# Patient Record
Sex: Female | Born: 1980 | Race: Black or African American | Hispanic: No | Smoking: Former smoker
Health system: Southern US, Community
[De-identification: ages and names within clinical notes are randomized; demographics above are authoritative.]

## PROBLEM LIST (undated history)

## (undated) DIAGNOSIS — D649 Anemia, unspecified: Secondary | ICD-10-CM

## (undated) DIAGNOSIS — I10 Essential (primary) hypertension: Secondary | ICD-10-CM

---

## 2002-04-25 ENCOUNTER — Other Ambulatory Visit: Admission: RE | Admit: 2002-04-25 | Discharge: 2002-04-25 | Payer: Self-pay | Admitting: *Deleted

## 2002-05-11 ENCOUNTER — Ambulatory Visit (HOSPITAL_COMMUNITY): Admission: RE | Admit: 2002-05-11 | Discharge: 2002-05-11 | Payer: Self-pay | Admitting: *Deleted

## 2002-05-11 ENCOUNTER — Encounter: Payer: Self-pay | Admitting: *Deleted

## 2002-05-16 ENCOUNTER — Inpatient Hospital Stay (HOSPITAL_COMMUNITY): Admission: AD | Admit: 2002-05-16 | Discharge: 2002-05-19 | Payer: Self-pay | Admitting: *Deleted

## 2003-09-23 HISTORY — PX: KNEE SURGERY: SHX244

## 2003-09-23 HISTORY — PX: ANKLE SURGERY: SHX546

## 2006-06-16 ENCOUNTER — Ambulatory Visit (HOSPITAL_COMMUNITY): Admission: AD | Admit: 2006-06-16 | Discharge: 2006-06-16 | Payer: Self-pay | Admitting: Gynecology

## 2006-06-16 ENCOUNTER — Encounter (INDEPENDENT_AMBULATORY_CARE_PROVIDER_SITE_OTHER): Payer: Self-pay | Admitting: *Deleted

## 2006-06-16 ENCOUNTER — Ambulatory Visit: Payer: Self-pay | Admitting: Gynecology

## 2006-07-03 ENCOUNTER — Ambulatory Visit: Payer: Self-pay | Admitting: Gynecology

## 2011-02-07 NOTE — Op Note (Signed)
Tracy [name], Tracy [name]             ACCOUNT NO.:  [id number]   MEDICAL RECORD NO.:  [id number]          PATIENT TYPE:  AMB   LOCATION:  [facility]                           FACILITY:  [facility]   PHYSICIAN:  Ginger Carne, MD  DATE OF BIRTH:  Jun 20, 1981   DATE OF PROCEDURE:  06/16/2006  DATE OF DISCHARGE:                                 OPERATIVE REPORT   PREOPERATIVE DIAGNOSIS:  First trimester incomplete abortion.   POSTOPERATIVE DIAGNOSIS:  First trimester incomplete abortion.   PROCEDURE:  Aspiration dilatation and curettage.   SURGEON:  Ginger Carne, MD   ASSISTANT:  None.   COMPLICATIONS:  None immediate.   ESTIMATED BLOOD LOSS:  Minimal.   ANESTHESIA:  General.   SPECIMEN:  Products of conception to pathology.   OPERATIVE FINDINGS:  External genitalia, vulva and vagina appeared normal.  The cervix demonstrated no evidence of erosions or lesions.  Uterus sounded  to 10 cm.  Both adnexa palpable found to be normal, products of conception  noted and sent to pathology.   OPERATIVE PROCEDURE:  The patient prepped in the usual fashion and placed  lithotomy position.  Betadine solution used for antiseptic.  The patient  catheterized prior to procedure.  After adequate general anesthesia,  tenaculum placed on the anterior lip of the cervix.  #8 suction cannulae  used for removal of intrauterine contents, specimen to pathology.  Minimal  bleeding noted at the end of the procedure.  The patient returned to the  post anesthesia recovery room in excellent condition.      Ginger Carne, MD  Electronically Signed     SHB/MEDQ  D:  06/16/2006  T:  06/18/2006  Job:  [id number]

## 2014-09-22 HISTORY — PX: OVARIAN CYST REMOVAL: SHX89

## 2016-09-22 HISTORY — PX: OVARIAN CYST REMOVAL: SHX89

## 2020-06-13 ENCOUNTER — Encounter: Payer: Self-pay | Admitting: Obstetrics and Gynecology

## 2020-06-13 ENCOUNTER — Other Ambulatory Visit: Payer: Self-pay

## 2020-06-13 ENCOUNTER — Other Ambulatory Visit (HOSPITAL_COMMUNITY)
Admission: RE | Admit: 2020-06-13 | Discharge: 2020-06-13 | Disposition: A | Discharge status: Home / Self Care | Payer: Medicaid Other | Source: Ambulatory Visit | Attending: Obstetrics and Gynecology | Admitting: Obstetrics and Gynecology

## 2020-06-13 ENCOUNTER — Ambulatory Visit: Payer: Medicaid Other | Admitting: Obstetrics and Gynecology

## 2020-06-13 VITALS — BP 148/91 | HR 99 | Ht 69.0 in | Wt 198.0 lb

## 2020-06-13 DIAGNOSIS — Z01411 Encounter for gynecological examination (general) (routine) with abnormal findings: Secondary | ICD-10-CM | POA: Diagnosis present

## 2020-06-13 DIAGNOSIS — D259 Leiomyoma of uterus, unspecified: Secondary | ICD-10-CM | POA: Diagnosis not present

## 2020-06-13 NOTE — Progress Notes (Signed)
NGYN pt referred from [facility] d/t fibroids.  Pt needs pap smear. STD testing offered pt; declined

## 2020-06-13 NOTE — Progress Notes (Signed)
Subjective:     Tracy [name] is a 39 y.o. female and is here for a comprehensive physical exam. The patient reports history of AUB. She describes a monthly period lasting 7 days, heavy in flow, associated with dysmenorrhea. Patient reports a history of fibroid uterus. Patient reports intermittent pelvic pain which is worse with her periods. She is not sexually active. She also admits to occasional urinary incontinence due to pelvic pressure  History reviewed. No pertinent past medical history. Past Surgical History:  Procedure Laterality Date  . ANKLE SURGERY  2005  . KNEE SURGERY  2005   both   . OVARIAN CYST REMOVAL  2016  . OVARIAN CYST REMOVAL  2018   Family History  Problem Relation Age of Onset  . Diabetes Maternal Grandfather   . Hypertension Maternal Grandfather      Social History   Socioeconomic History  . Marital status: Legally Separated    Spouse name: Not on file  . Number of children: Not on file  . Years of education: Not on file  . Highest education level: Not on file  Occupational History  . Not on file  Tobacco Use  . Smoking status: Former Research scientist (life sciences)  . Smokeless tobacco: Never Used  Substance and Sexual Activity  . Alcohol use: Not Currently  . Drug use: Never  . Sexual activity: Not Currently    Birth control/protection: None  Other Topics Concern  . Not on file  Social History Narrative  . Not on file   Social Determinants of Health   Financial Resource Strain:   . Difficulty of Paying Living Expenses: Not on file  Food Insecurity:   . Worried About Charity fundraiser in the Last Year: Not on file  . Ran Out of Food in the Last Year: Not on file  Transportation Needs:   . Lack of Transportation (Medical): Not on file  . Lack of Transportation (Non-Medical): Not on file  Physical Activity:   . Days of Exercise per Week: Not on file  . Minutes of Exercise per Session: Not on file  Stress:   . Feeling of Stress : Not on file  Social  Connections:   . Frequency of Communication with Friends and Family: Not on file  . Frequency of Social Gatherings with Friends and Family: Not on file  . Attends Religious Services: Not on file  . Active Member of Clubs or Organizations: Not on file  . Attends Archivist Meetings: Not on file  . Marital Status: Not on file  Intimate Partner Violence:   . Fear of Current or Ex-Partner: Not on file  . Emotionally Abused: Not on file  . Physically Abused: Not on file  . Sexually Abused: Not on file   Health Maintenance  Topic Date Due  . Hepatitis C Screening  Never done  . COVID-19 Vaccine (1) Never done  . HIV Screening  Never done  . TETANUS/TDAP  Never done  . PAP SMEAR-Modifier  Never done  . INFLUENZA VACCINE  Never done       Review of Systems Pertinent items noted in HPI and remainder of comprehensive ROS otherwise negative.   Objective:  Blood pressure (!) 148/91, pulse 99, height 5\' 9"  (1.753 m), weight 198 lb (89.8 kg).     GENERAL: Well-developed, well-nourished female in no acute distress.  HEENT: Normocephalic, atraumatic. Sclerae anicteric.  NECK: Supple. Normal thyroid.  LUNGS: Clear to auscultation bilaterally.  HEART: Regular rate and rhythm. BREASTS: Symmetric  in size. No palpable masses or lymphadenopathy, skin changes, or nipple drainage. ABDOMEN: Soft, nontender, nondistended. Palpable fibroid uterus PELVIC: Normal external female genitalia. Vagina is pink and rugated.  Normal discharge. Normal appearing cervix. Uterus is 18-weeks in size. No adnexal mass or tenderness. EXTREMITIES: No cyanosis, clubbing, or edema, 2+ distal pulses.    Assessment:    Healthy female exam.      Plan:    Pap smear collected Pelvic ultrasound ordered Discussed surgical management of AUB with hysterectomy, myomectomy and [place]. Patient undecided at this time but is considering a hysterectomy. Informed patient that due to rising cases of covid, major surgery may  be delayed into January/February Patient verbalized understanding and all questions were answered See After Visit Summary for Counseling Recommendations

## 2020-06-13 NOTE — Patient Instructions (Signed)
Uterine Fibroids  Uterine fibroids (leiomyomas) are noncancerous (benign) tumors that can develop in the uterus. Fibroids may also develop in the fallopian tubes, cervix, or tissues (ligaments) near the uterus. You may have one or many fibroids. Fibroids vary in size, weight, and where they grow in the uterus. Some can become quite large. Most fibroids do not require medical treatment. What are the causes? The cause of this condition is not known. What increases the risk? You are more likely to develop this condition if you:  Are in your 30s or 40s and have not gone through menopause.  Have a family history of this condition.  Are of African-American descent.  Had your first period at an early age (early menarche).  Have not had any children (nulliparity).  Are overweight or obese. What are the signs or symptoms? Many women do not have any symptoms. Symptoms of this condition may include:  Heavy menstrual bleeding.  Bleeding or spotting between periods.  Pain and pressure in the pelvic area, between the hips.  Bladder problems, such as needing to urinate urgently or more often than usual.  Inability to have children (infertility).  Failure to carry pregnancy to term (miscarriage). How is this diagnosed? This condition may be diagnosed based on:  Your symptoms and medical history.  A physical exam.  A pelvic exam that includes feeling for any tumors.  Imaging tests, such as ultrasound or MRI. How is this treated? Treatment for this condition may include:  Seeing your health care provider for follow-up visits to monitor your fibroids for any changes.  Taking NSAIDs such as ibuprofen, naproxen, or aspirin to reduce pain.  Hormone medicines. These may be taken as a pill, given in an injection, or delivered by a T-shaped device that is inserted into the uterus (intrauterine device, IUD).  Surgery to remove one of the following: ? The fibroids (myomectomy). Your health  care provider may recommend this if fibroids affect your fertility and you want to become pregnant. ? The uterus (hysterectomy). ? Blood supply to the fibroids (uterine artery embolization). Follow these instructions at home:  Take over-the-counter and prescription medicines only as told by your health care provider.  Ask your health care provider if you should take iron pills or eat more iron-rich foods, such as dark green, leafy vegetables. Heavy menstrual bleeding can cause low iron levels.  If directed, apply heat to your back or abdomen to reduce pain. Use the heat source that your health care provider recommends, such as a moist heat pack or a heating pad. ? Place a towel between your skin and the heat source. ? Leave the heat on for 20-30 minutes. ? Remove the heat if your skin turns bright red. This is especially important if you are unable to feel pain, heat, or cold. You may have a greater risk of getting burned.  Pay close attention to your menstrual cycle. Tell your health care provider about any changes, such as: ? Increased blood flow that requires you to use more pads or tampons than usual. ? A change in the number of days that your period lasts. ? A change in symptoms that are associated with your period, such as back pain or cramps in your abdomen.  Keep all follow-up visits as told by your health care provider. This is important, especially if your fibroids need to be monitored for any changes. Contact a health care provider if you:  Have pelvic pain, back pain, or cramps in your abdomen that   do not get better with medicine or heat.  Develop new bleeding between periods.  Have increased bleeding during or between periods.  Feel unusually tired or weak.  Feel light-headed. Get help right away if you:  Faint.  Have pelvic pain that suddenly gets worse.  Have severe vaginal bleeding that soaks a tampon or pad in 30 minutes or less. Summary  Uterine fibroids are  noncancerous (benign) tumors that can develop in the uterus.  The exact cause of this condition is not known.  Most fibroids do not require medical treatment unless they affect your ability to have children (fertility).  Contact a health care provider if you have pelvic pain, back pain, or cramps in your abdomen that do not get better with medicines.  Make sure you know what symptoms should cause you to get help right away. This information is not intended to replace advice given to you by your health care provider. Make sure you discuss any questions you have with your health care provider. Document Revised: 08/21/2017 Document Reviewed: 08/04/2017 Elsevier Patient Education  2020 [employer].   Myomectomy  Myomectomy is a surgery in which a non-cancerous fibroid (myoma) is removed from the uterus. Myomas are tumors made up of fibrous tissue. They are often called fibroid tumors. Fibroid tumors can range from the size of a pea to the size of a grapefruit. In a myomectomy, the fibroid tumor is removed without removing the uterus. Because these tumors are rarely cancerous, this surgery is usually done only if the tumor is growing or causing symptoms such as pain, pressure, bleeding, or pain with intercourse. Tell a health care provider about:  Any allergies you have.  All medicines you are taking, including vitamins, herbs, eye drops, creams, and over-the-counter medicines.  Any problems you or family members have had with anesthetic medicines.  Any blood disorders you have.  Any surgeries you have had.  Any medical conditions you have. What are the risks? Generally, this is a safe procedure. However, problems may occur, including:  Bleeding.  Infection.  Allergic reactions to medicines.  Damage to other structures or organs.  Blood clots in the legs, chest, or brain.  Scar tissue on other organs and in the pelvis. This may require another surgery to remove the scar  tissue. What happens before the procedure? Staying hydrated Follow instructions from your health care provider about hydration, which may include:  Up to 2 hours before the procedure - you may continue to drink clear liquids, such as water, clear fruit juice, black coffee, and plain tea. Eating and drinking restrictions Follow instructions from your health care provider about eating and drinking, which may include:  8 hours before the procedure - stop eating heavy meals or foods such as meat, fried foods, or fatty foods.  6 hours before the procedure - stop eating light meals or foods, such as toast or cereal.  6 hours before the procedure - stop drinking milk or drinks that contain milk.  2 hours before the procedure - stop drinking clear liquids. General instructions  Ask your health care provider about: ? Changing or stopping your regular medicines. This is especially important if you are taking diabetes medicines or blood thinners. ? Taking medicines such as aspirin and ibuprofen. These medicines can thin your blood. Do not take these medicines before your procedure if your health care provider instructs you not to.  Do not drink alcohol the day before the surgery.  Do not use any products that  contain nicotine or tobacco, such as cigarettes and e-cigarettes, for 2 weeks before the procedure. If you need help quitting, ask your health care provider.  Plan to have someone take you home from the hospital or clinic. Also arrange for someone to help you with activities during your recovery. What happens during the procedure?  To reduce your risk of infection: ? Your health care team will wash or sanitize their hands. ? Your skin will be washed with soap. ? Hair may be removed from the surgical area.  An IV tube will be inserted into one of your veins. Medicines will be able to flow directly into your body through this IV tube.  You will be given one or more of the following: ? A  medicine to help you relax (sedative). ? A medicine to make you fall asleep (general anesthetic).  Small monitors will be attached to your body. They will be used to check your heart, blood pressure, and oxygen level.  A breathing tube will be placed into your lungs during the procedure.  A thin, flexible tube (catheter) will be inserted into your bladder to collect urine.  Your surgeon will use one of the following methods to perform the procedure. The method used will depend on the size, shape, location, and number of fibroids. Hysteroscopic myomectomy This method may be used when the fibroid tumor is inside the cavity of the uterus. A long, thin tube with a lens (hysteroscope) will be inserted into the uterus through the vagina. A saline solution will be put into the uterus. This will expand the uterus and allow the surgeon to see the fibroids. Tools will be passed through the hysteroscope to remove the fibroid tumor in pieces. Laparoscopic myomectomy A few small incisions will be made in the lower abdomen. A thin, lighted tube with a camera (laparoscope) will be inserted through one of the incisions. This will give the surgeon a good view of the area. The fibroid tumor will be removed through the other incisions. The incisions will then be closed with stitches (sutures) or staples. Abdominal myomectomy This method is used when the fibroid tumor cannot be removed with a hysteroscope or laparoscope. The surgery will be done through a larger surgical incision in the abdomen. The fibroid tumor will be removed through this incision. The incision will be closed with sutures or staples. Recovery time will be longer if this method is used. The procedures may vary among health care providers and hospitals. What happens after the procedure?  Your blood pressure, heart rate, breathing rate, and blood oxygen level will be monitored until the medicines you were given have worn off.  The IV access tube  and catheter will remain on your body for a period of time.  You may be given medicine for pain or to help you sleep.  You may be given an antibiotic medicine if needed.  Do not drive for 24 hours if you were given a sedative. Summary  Myomectomy is surgery to remove a noncancerous fibroid (myoma) from the uterus.  This surgery is usually done only if the tumor is growing or causing symptoms such as pain, pressure, bleeding, or pain during intercourse.  Follow instructions from your health care provider about eating and drinking before the procedure.  Recovery time from this procedure depends on the method. The abdominal method will require a longer recovery time. This information is not intended to replace advice given to you by your health care provider. Make sure you discuss  any questions you have with your health care provider. Document Revised: 12/31/2018 Document Reviewed: 10/09/2016 Elsevier Patient Education  2020 [employer].  Uterine Artery Embolization for Fibroids  Uterine artery embolization is a procedure to shrink uterine fibroids. Uterine fibroids are masses of tissue (tumors) that can develop in the womb (uterus). They are also called leiomyomas. This type of tumor is not cancerous (benign) and does not spread to other parts of the body outside of the pelvic area. The pelvic area is the part of the body between the hip bones. You can have one or many fibroids. Fibroids can vary in size, shape, weight, and where they grow in the uterus. Some can become quite large. In this procedure, a thin plastic tube (catheter) is used to inject a chemical that blocks off the blood supply to the fibroid, which causes the fibroid to shrink. Tell a health care provider about:  Any allergies you have.  All medicines you are taking, including vitamins, herbs, eye drops, creams, and over-the-counter medicines.  Any problems you or family members have had with anesthetic medicines.  Any  blood disorders you have.  Any surgeries you have had.  Any medical conditions you have.  Whether you are pregnant or may be pregnant. What are the risks? Generally, this is a safe procedure. However, problems may occur, including:  Bleeding.  Allergic reactions to medicines or dyes.  Damage to other structures or organs.  Infection, including blood infection (septicemia).  Injury to the uterus from decreased blood supply.  Lack of menstrual periods (amenorrhea).  Death of tissue cells (necrosis) around your bladder or vulva.  Development of a hole between organs or from an organ to the surface of your skin (fistula).  Blood clot in the legs (deep vein thrombosis) or lung (pulmonary embolus).  Nausea and vomiting. What happens before the procedure? Staying hydrated Follow instructions from your health care provider about hydration, which may include:  Up to 2 hours before the procedure - you may continue to drink clear liquids, such as water, clear fruit juice, black coffee, and plain tea. Eating and drinking restrictions Follow instructions from your health care provider about eating and drinking, which may include:  8 hours before the procedure - stop eating heavy meals or foods such as meat, fried foods, or fatty foods.  6 hours before the procedure - stop eating light meals or foods, such as toast or cereal.  6 hours before the procedure - stop drinking milk or drinks that contain milk.  2 hours before the procedure - stop drinking clear liquids. Medicines  Ask your health care provider about: ? Changing or stopping your regular medicines. This is especially important if you are taking diabetes medicines or blood thinners. ? Taking over-the-counter medicines, vitamins, herbs, and supplements. ? Taking medicines such as aspirin and ibuprofen. These medicines can thin your blood. Do not take these medicines unless your health care provider tells you to take  them.  You may be given antibiotic medicine to help prevent infection.  You may be given medicine to prevent nausea and vomiting (antiemetic). General instructions  Ask your health care provider how your surgical site will be marked or identified.  You may be asked to shower with a germ-killing soap.  Plan to have someone take you home from the hospital or clinic.  If you will be going home right after the procedure, plan to have someone with you for 24 hours.  You will be asked to empty your  bladder. What happens during the procedure?  To lower your risk of infection: ? Your health care team will wash or sanitize their hands. ? Hair may be removed from the surgical area. ? Your skin will be washed with soap.  An IV will be inserted into one of your veins.  You will be given one or more of the following: ? A medicine to help you relax (sedative). ? A medicine to numb the area (local anesthetic).  A small cut (incision) will be made in your groin.  A catheter will be inserted into the main artery of your leg. The catheter will be guided through the artery to your uterus.  A series of images will be taken while dye is injected through the catheter in your groin. X-rays are taken at the same time. This is done to provide a road map of the blood supply to your uterus and fibroids.  Tiny plastic spheres, about the size of sand grains, will be injected through the catheter. Metal coils may be used to help block the artery. The particles will lodge in tiny branches of the uterine artery that supplies blood to the fibroids.  The procedure will be repeated on the artery that supplies the other side of the uterus.  The catheter will be removed and pressure will be applied to stop the bleeding.  A dressing will be placed over the incision. The procedure may vary among health care providers and hospitals. What happens after the procedure?  Your blood pressure, heart rate, breathing  rate, and blood oxygen level will be monitored until the medicines you were given have worn off.  You will be given pain medicine as needed.  You may be given medicine for nausea and vomiting as needed.  Do not drive for 24 hours if you were given a sedative. Summary  Uterine artery embolization is a procedure to shrink uterine fibroids by blocking the blood supply to the fibroid.  You may be given a sedative and local anesthetic for the procedure.  A catheter will be inserted into the main artery of your leg. The catheter will be guided through the artery to your uterus.  After the procedure you will be given pain medicine and medicine for nausea as needed.  Do not drive for 24 hours if you were given a sedative. This information is not intended to replace advice given to you by your health care provider. Make sure you discuss any questions you have with your health care provider. Document Revised: 08/21/2017 Document Reviewed: 12/11/2016 Elsevier Patient Education  [employer].  Hysterectomy Information  A hysterectomy is a surgery in which the uterus is removed. The fallopian tubes and ovaries may be removed (bilateral salpingo-oophorectomy) as well. This procedure may be done to treat various medical problems. After the procedure, a woman will no longer have menstrual periods nor will she be able to become pregnant (sterile). What are the reasons for a hysterectomy? There are many reasons why a woman might have this procedure. They include:  Persistent, abnormal vaginal bleeding.  Long-term (chronic) pelvic pain or infection.  Endometriosis. This is when the lining of the uterus (endometrium) starts to grow outside the uterus.  Adenomyosis. This is when the endometrium starts to grow in the muscle of the uterus.  Pelvic organ prolapse. This is a condition in which the uterus falls down into the vagina.  Noncancerous growths in the uterus (uterine fibroids) that cause  symptoms.  The presence of precancerous cells.  Cervical or uterine cancer. What are the different types of hysterectomy? There are three different types of hysterectomy:  Supracervical hysterectomy. In this type, the top part of the uterus is removed, but not the cervix.  Total hysterectomy. In this type, the uterus and cervix are removed.  Radical hysterectomy. In this type, the uterus, the cervix, and the tissue that holds the uterus in place (parametrium) are removed. What are the different ways a hysterectomy can be performed? There are many different ways a hysterectomy can be performed, including:  Abdominal hysterectomy. In this type, an incision is made in the abdomen. The uterus is removed through this incision.  Vaginal hysterectomy. In this type, an incision is made in the vagina. The uterus is removed through this incision. There are no abdominal incisions.  Conventional laparoscopic hysterectomy. In this type, three or four small incisions are made in the abdomen. A thin, lighted tube with a camera (laparoscope) is inserted into one of the incisions. Other tools are put through the other incisions. The uterus is cut into small pieces. The small pieces are removed through the incisions or through the vagina.  Laparoscopically assisted vaginal hysterectomy (LAVH). In this type, three or four small incisions are made in the abdomen. Part of the surgery is performed laparoscopically and the other part is done vaginally. The uterus is removed through the vagina.  Robot-assisted laparoscopic hysterectomy. In this type, a laparoscope and other tools are inserted into three or four small incisions in the abdomen. A computer-controlled device is used to give the surgeon a 3D image and to help control the surgical instruments. This allows for more precise movements of surgical instruments. The uterus is cut into small pieces and removed through the incisions or removed through the  vagina. Discuss the options with your health care provider to determine which type is the right one for you. What are the risks? Generally, this is a safe procedure. However, problems may occur, including:  Bleeding and risk of blood transfusion. Tell your health care provider if you do not want to receive any blood products.  Blood clots in the legs or lung.  Infection.  Damage to other structures or organs.  Allergic reactions to medicines.  Changing to an abdominal hysterectomy from one of the other techniques. What to expect after a hysterectomy  You will be given pain medicine.  You may need to stay in the hospital for 1- 2 days to recover, depending on the type of hysterectomy you had.  Follow your health care provider's instructions about exercise, driving, and general activities. Ask your health care provider what activities are safe for you.  You will need to have someone with you for the first 3-5 days after you go home.  You will need to follow up with your surgeon in 2-4 weeks after surgery to evaluate your progress.  If the ovaries are removed, you will have early menopause symptoms such as hot flashes, night sweats, and insomnia.  If you had a hysterectomy for a problem that was not cancer or not a condition that could lead to cancer, then you no longer need Pap tests. However, even if you no longer need a Pap test, a regular pelvic exam is a good idea to make sure no other problems are developing. Questions to ask your health care provider  Is a hysterectomy medically necessary? Do I have other treatment options for my condition?  What are my options for hysterectomy procedure?  What organs and tissues  need to be removed?  What are the risks?  What are the benefits?  How long will I need to stay in the hospital after the procedure?  How long will I need to recover at home?  What symptoms can I expect after the procedure? Summary  A hysterectomy is a  surgery in which the uterus is removed. The fallopian tubes and ovaries may be removed (bilateral salpingo-oophorectomy) as well.  This procedure may be done to treat various medical problems. After the procedure, a woman will no longer have menstrual periods nor will she be able to become pregnant.  Discuss the options with your health care provider to determine which type of hysterectomy is the right one for you. This information is not intended to replace advice given to you by your health care provider. Make sure you discuss any questions you have with your health care provider. Document Revised: 08/21/2017 Document Reviewed: 10/15/2016 Elsevier Patient Education  2020 [employer].

## 2020-06-15 LAB — CYTOLOGY - PAP
Adequacy: ABSENT
Comment: NEGATIVE
Diagnosis: NEGATIVE
High risk HPV: NEGATIVE

## 2020-06-19 ENCOUNTER — Other Ambulatory Visit: Payer: Self-pay

## 2020-06-19 ENCOUNTER — Ambulatory Visit
Admission: RE | Admit: 2020-06-19 | Discharge: 2020-06-19 | Disposition: A | Discharge status: Home / Self Care | Payer: Medicaid Other | Source: Ambulatory Visit | Attending: Obstetrics and Gynecology | Admitting: Obstetrics and Gynecology

## 2020-06-19 DIAGNOSIS — D259 Leiomyoma of uterus, unspecified: Secondary | ICD-10-CM | POA: Diagnosis present

## 2020-06-27 ENCOUNTER — Other Ambulatory Visit: Payer: Self-pay | Admitting: Obstetrics and Gynecology

## 2020-07-04 ENCOUNTER — Telehealth: Payer: Self-pay

## 2020-07-04 NOTE — Telephone Encounter (Signed)
Patient has been made aware  of ultrasound results and would like to just go ahead and have an hysterectomy. She is finish with having children. I have schedule her for a virtual appointment on 08/09/2020 however she would just like to have the surgery scheduled.

## 2020-07-04 NOTE — Telephone Encounter (Signed)
This patient has been made aware of scheduled surgery however she would like to have an hysterectomy instead of removal of fibroids. Please advise

## 2020-07-30 ENCOUNTER — Other Ambulatory Visit: Payer: Self-pay

## 2020-07-30 ENCOUNTER — Encounter: Payer: Self-pay | Admitting: Obstetrics and Gynecology

## 2020-07-30 ENCOUNTER — Telehealth (INDEPENDENT_AMBULATORY_CARE_PROVIDER_SITE_OTHER): Payer: Medicaid Other | Admitting: Obstetrics and Gynecology

## 2020-07-30 VITALS — BP 156/99 | HR 93 | Wt 202.0 lb

## 2020-07-30 DIAGNOSIS — Z712 Person consulting for explanation of examination or test findings: Secondary | ICD-10-CM

## 2020-07-30 NOTE — Progress Notes (Signed)
39 yo P2 here to discuss results of her pelvic ultrasound. Patient had ultrasound ordered as part of the evaluation of menorrhagia with regular cycles and dysmenorrhea. Patient is currently without any complaints and waiting for her surgery date  No past medical history on file. Past Surgical History:  Procedure Laterality Date  . ANKLE SURGERY  2005  . KNEE SURGERY  2005   both   . OVARIAN CYST REMOVAL  2016  . OVARIAN CYST REMOVAL  2018   Family History  Problem Relation Age of Onset  . Diabetes Maternal Grandfather   . Hypertension Maternal Grandfather    Social History   Tobacco Use  . Smoking status: Former Research scientist (life sciences)  . Smokeless tobacco: Never Used  Substance Use Topics  . Alcohol use: Not Currently  . Drug use: Never   ROS See pertinent in HPI. All other systems reviewed with the patient  Blood pressure (!) 156/99, pulse 93, weight 202 lb (91.6 kg). GENERAL: Well-developed, well-nourished female in no acute distress.  NEURO: alert and oriented x3  05/2020 ultrasound FINDINGS: Uterus  Measurements: 16.2 x 9.8 x 11.8 cm = volume: 974 mL. Anteverted. Enlarged and heterogeneous, containing multiple masses consistent with leiomyomata. Largest of these include an exophytic 6.4 x 7.4 x 5.5 cm RIGHT fundal leiomyoma, 6.7 x 6.4 x 5.0 cm anterior LEFT leiomyoma, and 5.8 x 4.5 x 4.3 cm posterior leiomyoma. The anterior and posterior leiomyomata extend submucosal.  Endometrium  Thickness: 5 mm. Small amount of endometrial fluid at upper uterine segment.  Right ovary  Measurements: 4.2 x 3.2 x 2.5 cm = volume: 17.9 mL. Cyst within RIGHT ovary 2.2 x 3.2 x 2.9 cm, without definite septation or mural nodularity. No additional masses.  Left ovary  Measurements: 3.1 x 2.0 x 2.2 cm = volume: 7.1 mL. Normal morphology without mass  Other findings  No free pelvic fluid.  No adnexal masses.  IMPRESSION: 3.2 cm diameter simple appearing RIGHT ovarian cyst; no  follow-up imaging is recommended.  Reference: Radiology 2010 Sep;256(3):943-54  Multiple uterine leiomyomata, some of which extend submucosal.  Small amount of nonspecific endometrial fluid at upper uterine segment endometrial canal.  A/P 39 yo with AUB and symtomatic fibroid uterus - Ultrasound results reviewed with the patient - Patient scheduled for definitive treatment with hysterectomy on 09/04/20 - All questions were concerned - Patient to follow up with PCP for hypertension management

## 2020-08-30 NOTE — Progress Notes (Signed)
[facility] [address], [place] AT [address] [place] [address] Phone: [phone] Fax: [phone]      Your procedure is scheduled on Tuesday, December 14th.  Report to [facility] Main Entrance "A" at 8:30 A.M., and check in at the Admitting office.  Call this number if you have problems the morning of surgery:  [phone]  Call [phone] if you have any questions prior to your surgery date Monday-Friday 8am-4pm    Remember:  Do not eat after midnight the night before your surgery  You may drink clear liquids until 7:30 AM the morning of your surgery.   Clear liquids allowed are: Water, Non-Citrus Juices (without pulp), Carbonated Beverages, Clear Tea, Black Coffee Only, and Gatorade    Take these medicines the morning of surgery with A SIP OF WATER   Amlodipine (Norvasc)  Eye drops - if needed  As of today, STOP taking any Aspirin (unless otherwise instructed by your surgeon) Aleve, Naproxen, Ibuprofen, Motrin, Advil, Goody's, BC's, all herbal medications, fish oil, and all vitamins.                      Do not wear jewelry, make up, or nail polish            Do not wear lotions, powders, perfumes, or deodorant.            Do not shave 48 hours prior to surgery.              Do not bring valuables to the hospital.            [facility] is not responsible for any belongings or valuables.  Do NOT Smoke (Tobacco/Vaping) or drink Alcohol 24 hours prior to your procedure If you use a CPAP at night, you may bring all equipment for your overnight stay.   Contacts, glasses, dentures or bridgework may not be worn into surgery.      For patients admitted to the hospital, discharge time will be determined by your treatment team.   Patients discharged the day of surgery will not be allowed to drive home, and someone needs to stay with them for 24 hours.    Special instructions:   [place]-  Preparing For Surgery  Before surgery, you can play an important role. Because skin is not sterile, your skin needs to be as free of germs as possible. You can reduce the number of germs on your skin by washing with CHG (chlorahexidine gluconate) Soap before surgery.  CHG is an antiseptic cleaner which kills germs and bonds with the skin to continue killing germs even after washing.    Oral Hygiene is also important to reduce your risk of infection.  Remember - BRUSH YOUR TEETH THE MORNING OF SURGERY WITH YOUR REGULAR TOOTHPASTE  Please do not use if you have an allergy to CHG or antibacterial soaps. If your skin becomes reddened/irritated stop using the CHG.  Do not shave (including legs and underarms) for at least 48 hours prior to first CHG shower. It is OK to shave your face.  Please follow these instructions carefully.   1. Shower the NIGHT BEFORE SURGERY and the MORNING OF SURGERY with CHG Soap.   2. If you chose to wash your hair, wash your hair first as usual with your normal shampoo.  3. After you shampoo, rinse your hair and body thoroughly to  remove the shampoo.  4. Use CHG as you would any other liquid soap. You can apply CHG directly to the skin and wash gently with a scrungie or a clean washcloth.   5. Apply the CHG Soap to your body ONLY FROM THE NECK DOWN.  Do not use on open wounds or open sores. Avoid contact with your eyes, ears, mouth and genitals (private parts). Wash Face and genitals (private parts)  with your normal soap.   6. Wash thoroughly, paying special attention to the area where your surgery will be performed.  7. Thoroughly rinse your body with warm water from the neck down.  8. DO NOT shower/wash with your normal soap after using and rinsing off the CHG Soap.  9. Pat yourself dry with a CLEAN TOWEL.  10. Wear CLEAN PAJAMAS to bed the night before surgery  11. Place CLEAN SHEETS on your bed the night of your first shower and DO NOT SLEEP WITH  PETS.   Day of Surgery: Wear Clean/Comfortable clothing the morning of surgery Do not apply any deodorants/lotions.   Remember to brush your teeth WITH YOUR REGULAR TOOTHPASTE.   Please read over the following fact sheets that you were given.

## 2020-08-31 ENCOUNTER — Encounter (HOSPITAL_COMMUNITY): Payer: Self-pay

## 2020-08-31 ENCOUNTER — Encounter (HOSPITAL_COMMUNITY)
Admission: RE | Admit: 2020-08-31 | Discharge: 2020-08-31 | Disposition: A | Discharge status: Home / Self Care | Payer: Medicaid Other | Source: Ambulatory Visit | Attending: Obstetrics and Gynecology | Admitting: Obstetrics and Gynecology

## 2020-08-31 ENCOUNTER — Other Ambulatory Visit (HOSPITAL_COMMUNITY)
Admission: RE | Admit: 2020-08-31 | Discharge: 2020-08-31 | Disposition: A | Discharge status: Home / Self Care | Payer: Medicaid Other | Source: Ambulatory Visit | Attending: Obstetrics and Gynecology | Admitting: Obstetrics and Gynecology

## 2020-08-31 ENCOUNTER — Other Ambulatory Visit: Payer: Self-pay

## 2020-08-31 DIAGNOSIS — Z20822 Contact with and (suspected) exposure to covid-19: Secondary | ICD-10-CM | POA: Diagnosis not present

## 2020-08-31 DIAGNOSIS — Z01812 Encounter for preprocedural laboratory examination: Secondary | ICD-10-CM | POA: Insufficient documentation

## 2020-08-31 HISTORY — DX: Essential (primary) hypertension: I10

## 2020-08-31 HISTORY — DX: Anemia, unspecified: D64.9

## 2020-08-31 LAB — BASIC METABOLIC PANEL
Anion gap: 9 (ref 5–15)
BUN: 6 mg/dL (ref 6–20)
CO2: 25 mmol/L (ref 22–32)
Calcium: 9 mg/dL (ref 8.9–10.3)
Chloride: 106 mmol/L (ref 98–111)
Creatinine, Ser: 0.68 mg/dL (ref 0.44–1.00)
GFR, Estimated: >60 mL/min (ref >60)
Glucose, Bld: 98 mg/dL (ref 70–99)
Potassium: 4.1 mmol/L (ref 3.5–5.1)
Sodium: 140 mmol/L (ref 135–145)

## 2020-08-31 LAB — CBC
HCT: 30.2 % — ABNORMAL LOW (ref 36.0–46.0)
Hemoglobin: 8.5 g/dL — ABNORMAL LOW (ref 12.0–15.0)
MCH: 22.6 pg — ABNORMAL LOW (ref 26.0–34.0)
MCHC: 28.1 g/dL — ABNORMAL LOW (ref 30.0–36.0)
MCV: 80.3 fL (ref 80.0–100.0)
Platelets: 357 10*3/uL (ref 150–400)
RBC: 3.76 MIL/uL — ABNORMAL LOW (ref 3.87–5.11)
RDW: 17.2 % — ABNORMAL HIGH (ref 11.5–15.5)
WBC: 5 10*3/uL (ref 4.0–10.5)
nRBC: 0 % (ref 0.0–0.2)

## 2020-08-31 LAB — SARS CORONAVIRUS 2 (TAT 6-24 HRS): SARS Coronavirus 2: NEGATIVE

## 2020-08-31 NOTE — Progress Notes (Signed)
CRITICAL VALUE ALERT @ PRE-ADMISSION APPOINTMENT  Critical Value:  Hgb 8.5  Date & Time Noted:  08-31-20 @ 1358  Provider Notified: yes, via inbox message  Orders Received/Actions taken: chart sent to anesthesia for review

## 2020-08-31 NOTE — Progress Notes (Signed)
PCP - Tereasa Coop, PA ([place]) Cardiologist - recently referred to Cardiologist  Appointment in early January for Cardiology consult, per PCP Due to high BP, chest tightness, palpitations, & abnormal EKG (see PCP note on 07-31-20, care everywhere)  Pt also has referral to Hematologist for iron deficiency anemia, per PCP Appointment for Hematology & Oncology consult at [facility] [place] - early January  Chest x-ray - n/a EKG - 07-31-20 (notes in care everywhere), requested EKG tracing from PCP    ERAS Protcol - yes, no drink ordered or given  COVID TEST- 08-31-20   Anesthesia review: yes, see above Jeneen Rinks notified of upcoming referrals to Cardiology & Hematology.  Per Jeneen Rinks, request EKG tracing from PCP.  If EKG tracing not returned by DOS, need to get repeat EKG.  Patient denies shortness of breath, fever, cough and chest pain at PAT appointment   All instructions explained to the patient, with a verbal understanding of the material. Patient agrees to go over the instructions while at home for a better understanding. Patient also instructed to self quarantine after being tested for COVID-19. The opportunity to ask questions was provided.

## 2020-09-03 NOTE — Progress Notes (Signed)
Anesthesia Chart Review:  Patient recently evaluated by her PCP Tereasa Coop, PA-C for report of palpitations and chest tightness.  Per note 07/31/2020, symptoms felt likely due to anemia, "I think that all of her symptoms of chest tightness, palpitations, headaches are likely secondary to her significant anemia. Last Hgb 8.2 and missed hematology appointment. Borderline sinus tach on EKG but left axis present. Refer to cardiology to rule out underlying cardiac etiology but consider anemia as etiology of sxs." EKG showed borderline sinus tach, LAD, no ST elevation or depression. However, she was also referred to cardiology to rule out any underlying cardiac etiology.  She has not yet been seen, appointment is in January. Last seen by PCP 08/24/20, HTN noted to be under better control on Amlodipine.   Spoke with patient at PAT appointment, she says her palpitations have improved, denies any chest pain, denies shortness of breath, denies any exertional symptoms.   Preop labs reviewed, anemia with Hgb 8.5 consistent with recent history. Otherwise unremarkable.   EKG 07/31/2020 (copy on chart): Sinus tach.  Rate 101.  Left axis.  Wynonia Musty [facility]/Anesthesiology Phone [phone] 09/03/2020 2:35 PM

## 2020-09-03 NOTE — Anesthesia Preprocedure Evaluation (Addendum)
Anesthesia Evaluation  Patient identified by MRN, date of birth, ID band Patient awake    Reviewed: Allergy & Precautions, NPO status , Patient's Chart, lab work & pertinent test results  Airway Mallampati: II  TM Distance: >3 FB Neck ROM: Full    Dental  (+) Teeth Intact, Dental Advisory Given   Pulmonary former smoker,    breath sounds clear to auscultation       Cardiovascular hypertension,  Rhythm:Regular Rate:Normal     Neuro/Psych    GI/Hepatic   Endo/Other    Renal/GU      Musculoskeletal   Abdominal   Peds  Hematology   Anesthesia Other Findings   Reproductive/Obstetrics                            Anesthesia Physical Anesthesia Plan  ASA: II  Anesthesia Plan: General   Post-op Pain Management:    Induction: Intravenous  PONV Risk Score and Plan: Ondansetron and Dexamethasone  Airway Management Planned: Oral ETT  Additional Equipment:   Intra-op Plan:   Post-operative Plan: Extubation in OR  Informed Consent: I have reviewed the patients History and Physical, chart, labs and discussed the procedure including the risks, benefits and alternatives for the proposed anesthesia with the patient or authorized representative who has indicated his/her understanding and acceptance.     Dental advisory given  Plan Discussed with: CRNA and Anesthesiologist  Anesthesia Plan Comments: (PAT note by Karoline Caldwell, PA-C:  Patient recently evaluated by her PCP Tereasa Coop, PA-C for report of palpitations and chest tightness.  Per note 07/31/2020, symptoms felt likely due to anemia, "I think that all of her symptoms of chest tightness, palpitations, headaches are likely secondary to her significant anemia. Last Hgb 8.2 and missed hematology appointment. Borderline sinus tach on EKG but left axis present. Refer to cardiology to rule out underlying cardiac etiology but consider anemia as  etiology of sxs." EKG showed borderline sinus tach, LAD, no ST elevation or depression. However, she was also referred to cardiology to rule out any underlying cardiac etiology.  She has not yet been seen, appointment is in January. Last seen by PCP 08/24/20, HTN noted to be under better control on Amlodipine.   Spoke with patient at PAT appointment, she says her palpitations have improved, denies any chest pain, denies shortness of breath, denies any exertional symptoms.   Preop labs reviewed, anemia with Hgb 8.5 consistent with recent history. Otherwise unremarkable.   EKG 07/31/2020 (copy on chart): Sinus tach.  Rate 101.  Left axis.  )       Anesthesia Quick Evaluation

## 2020-09-03 NOTE — H&P (Signed)
Shahira Fiske is an 39 y.o. female P2 with menorrhagia and regular cycles presenting today for definitive treatment with hysterectomy. Patient is currently without complaints. She reports a monthly 7-day period heavy in flow associated with dysmenorrhea  Pertinent Gynecological History: Menses: regular every month without intermenstrual spotting Contraception: abstinence DES exposure: denies Blood transfusions: none Last pap: normal Date: 05/2020   Menstrual History: Patient's last menstrual period was 08/31/2020.    Past Medical History:  Diagnosis Date  . Anemia   . Hypertension     Past Surgical History:  Procedure Laterality Date  . ANKLE SURGERY  2005  . KNEE SURGERY  2005   both   . OVARIAN CYST REMOVAL  2016  . OVARIAN CYST REMOVAL  2018    Family History  Problem Relation Age of Onset  . Diabetes Maternal Grandfather   . Hypertension Maternal Grandfather     Social History:  reports that she has quit smoking. She has never used smokeless tobacco. She reports current alcohol use. She reports current drug use. Drug: Marijuana.  Allergies: No Known Allergies  Medications Prior to Admission  Medication Sig Dispense Refill Last Dose  . amLODipine (NORVASC) 5 MG tablet Take 5 mg by mouth daily.   09/04/2020 at 0700  . Aspirin-Caffeine (BC FAST PAIN RELIEF PO) Take 1 packet by mouth daily as needed (pain).   Past Month at Unknown time  . Carboxymethylcellul-Glycerin (LUBRICATING EYE DROPS OP) Place 1 drop into both eyes daily as needed (dry eyes).   Past Week at Unknown time  . Ferrous Sulfate (IRON PO) Take 2 tablets by mouth daily.   Past Week at Unknown time  . ibuprofen (ADVIL) 200 MG tablet Take 800-1,200 mg by mouth 2 (two) times daily as needed for moderate pain.   Past Week at Unknown time  . triamcinolone (KENALOG) 0.1 % Apply 1 application topically 2 (two) times daily as needed for rash.   More than a month at Unknown time    Review of Systems See  pertinent in HPI. All other systems reviewed and non contributory Blood pressure (!) 137/96, pulse 81, temperature 98.5 F (36.9 C), temperature source Oral, resp. rate 18, height 5\' 9"  (1.753 m), weight 92.5 kg, last menstrual period 08/31/2020, SpO2 100 %. Physical Exam GENERAL: Well-developed, well-nourished female in no acute distress.  LUNGS: Clear to auscultation bilaterally.  HEART: Regular rate and rhythm. ABDOMEN: Soft, nontender, nondistended. No organomegaly. PELVIC: Deferred to OR EXTREMITIES: No cyanosis, clubbing, or edema, 2+ distal pulses.  Results for orders placed or performed during the hospital encounter of 09/04/20 (from the past 24 hour(s))  ABO/Rh     Status: None (Preliminary result)   Collection Time: 09/04/20  8:40 AM  Result Value Ref Range   ABO/RH(D) PENDING     No results found. 05/2020 ultrasound 05/2020 ultrasound FINDINGS: Uterus  Measurements: 16.2 x 9.8 x 11.8 cm = volume: 974 mL. Anteverted. Enlarged and heterogeneous, containing multiple masses consistent with leiomyomata. Largest of these include an exophytic 6.4 x 7.4 x 5.5 cm RIGHT fundal leiomyoma, 6.7 x 6.4 x 5.0 cm anterior LEFT leiomyoma, and 5.8 x 4.5 x 4.3 cm posterior leiomyoma. The anterior and posterior leiomyomata extend submucosal.  Endometrium  Thickness: 5 mm. Small amount of endometrial fluid at upper uterine segment.  Right ovary  Measurements: 4.2 x 3.2 x 2.5 cm = volume: 17.9 mL. Cyst within RIGHT ovary 2.2 x 3.2 x 2.9 cm, without definite septation or mural nodularity. No additional masses.  Left ovary  Measurements: 3.1 x 2.0 x 2.2 cm = volume: 7.1 mL. Normal morphology without mass  Other findings  No free pelvic fluid. No adnexal masses.  IMPRESSION: 3.2 cm diameter simple appearing RIGHT ovarian cyst; no follow-up imaging is recommended.  Reference: Radiology 2010 Sep;256(3):943-54  Multiple uterine leiomyomata, some of which extend  submucosal.  Small amount of nonspecific endometrial fluid at upper uterine segment endometrial canal.  Assessment/Plan: 39 yo P2 with symptomatic fibroid uterus here for definitive treatment with hysterectomy - Risks, benefits and alternatives were explained including but not limited to risks of bleeding, infection and damage to adjacent organs. Patient verbalized understanding and all questions were answered  Marlayna Bannister 09/04/2020, 9:32 AM

## 2020-09-04 ENCOUNTER — Encounter (HOSPITAL_COMMUNITY)
Admission: RE | Disposition: A | Discharge status: Home / Self Care | Payer: Self-pay | Source: Home / Self Care | Attending: Obstetrics and Gynecology

## 2020-09-04 ENCOUNTER — Inpatient Hospital Stay (HOSPITAL_COMMUNITY): Payer: Medicaid Other | Admitting: Anesthesiology

## 2020-09-04 ENCOUNTER — Other Ambulatory Visit: Payer: Self-pay

## 2020-09-04 ENCOUNTER — Inpatient Hospital Stay (HOSPITAL_COMMUNITY): Payer: Medicaid Other | Admitting: Physician Assistant

## 2020-09-04 ENCOUNTER — Inpatient Hospital Stay (HOSPITAL_COMMUNITY)
Admission: RE | Admit: 2020-09-04 | Discharge: 2020-09-06 | DRG: 743 | Disposition: A | Discharge status: Home / Self Care | Payer: Medicaid Other | Attending: Obstetrics and Gynecology | Admitting: Obstetrics and Gynecology

## 2020-09-04 ENCOUNTER — Encounter (HOSPITAL_COMMUNITY): Payer: Self-pay | Admitting: Obstetrics and Gynecology

## 2020-09-04 DIAGNOSIS — N920 Excessive and frequent menstruation with regular cycle: Secondary | ICD-10-CM | POA: Diagnosis present

## 2020-09-04 DIAGNOSIS — N83201 Unspecified ovarian cyst, right side: Secondary | ICD-10-CM | POA: Diagnosis present

## 2020-09-04 DIAGNOSIS — Z90710 Acquired absence of both cervix and uterus: Secondary | ICD-10-CM | POA: Diagnosis present

## 2020-09-04 DIAGNOSIS — D259 Leiomyoma of uterus, unspecified: Principal | ICD-10-CM | POA: Diagnosis present

## 2020-09-04 DIAGNOSIS — Z87891 Personal history of nicotine dependence: Secondary | ICD-10-CM | POA: Diagnosis not present

## 2020-09-04 DIAGNOSIS — N946 Dysmenorrhea, unspecified: Secondary | ICD-10-CM | POA: Diagnosis present

## 2020-09-04 HISTORY — PX: CYSTOSCOPY: SHX5120

## 2020-09-04 HISTORY — PX: HYSTERECTOMY ABDOMINAL WITH SALPINGECTOMY: SHX6725

## 2020-09-04 LAB — POCT I-STAT EG7
Acid-base deficit: 3 mmol/L — ABNORMAL HIGH (ref 0.0–2.0)
Bicarbonate: 22.9 mmol/L (ref 20.0–28.0)
Calcium, Ion: 1.12 mmol/L — ABNORMAL LOW (ref 1.15–1.40)
HCT: 29 % — ABNORMAL LOW (ref 36.0–46.0)
Hemoglobin: 9.9 g/dL — ABNORMAL LOW (ref 12.0–15.0)
O2 Saturation: 93 %
Patient temperature: 35.7
Potassium: 3.5 mmol/L (ref 3.5–5.1)
Sodium: 140 mmol/L (ref 135–145)
TCO2: 24 mmol/L (ref 22–32)
pCO2, Ven: 41.2 mmHg — ABNORMAL LOW (ref 44.0–60.0)
pH, Ven: 7.347 (ref 7.250–7.430)
pO2, Ven: 67 mmHg — ABNORMAL HIGH (ref 32.0–45.0)

## 2020-09-04 LAB — CBC
HCT: 34.8 % — ABNORMAL LOW (ref 36.0–46.0)
Hemoglobin: 10.5 g/dL — ABNORMAL LOW (ref 12.0–15.0)
MCH: 24.4 pg — ABNORMAL LOW (ref 26.0–34.0)
MCHC: 30.2 g/dL (ref 30.0–36.0)
MCV: 80.9 fL (ref 80.0–100.0)
Platelets: 346 10*3/uL (ref 150–400)
RBC: 4.3 MIL/uL (ref 3.87–5.11)
RDW: 18.3 % — ABNORMAL HIGH (ref 11.5–15.5)
WBC: 11.9 10*3/uL — ABNORMAL HIGH (ref 4.0–10.5)
nRBC: 0 % (ref 0.0–0.2)

## 2020-09-04 LAB — CREATININE, SERUM
Creatinine, Ser: 0.91 mg/dL (ref 0.44–1.00)
GFR, Estimated: >60 mL/min (ref >60)

## 2020-09-04 LAB — POCT PREGNANCY, URINE: Preg Test, Ur: NEGATIVE

## 2020-09-04 LAB — ABO/RH: ABO/RH(D): O POS

## 2020-09-04 SURGERY — HYSTERECTOMY, TOTAL, ABDOMINAL, WITH SALPINGECTOMY
Anesthesia: General | Site: Urethra

## 2020-09-04 MED ORDER — ONDANSETRON HCL 4 MG PO TABS: 4.0000 mg | ORAL_TABLET | Freq: Four times a day (QID) | ORAL | Status: DC | PRN

## 2020-09-04 MED ORDER — FENTANYL CITRATE (PF) 100 MCG/2ML IJ SOLN: 50.0000 ug | Freq: Once | INTRAMUSCULAR | Status: AC

## 2020-09-04 MED ORDER — LACTATED RINGERS IV SOLN: INTRAVENOUS | Status: DC

## 2020-09-04 MED ORDER — PHENYLEPHRINE 40 MCG/ML (10ML) SYRINGE FOR IV PUSH (FOR BLOOD PRESSURE SUPPORT)
PREFILLED_SYRINGE | INTRAVENOUS | Status: AC
  Filled 2020-09-04: qty 10

## 2020-09-04 MED ORDER — POVIDONE-IODINE 10 % EX SWAB: 2.0000 "application " | Freq: Once | CUTANEOUS | Status: DC

## 2020-09-04 MED ORDER — AMLODIPINE BESYLATE 5 MG PO TABS
5.0000 mg | ORAL_TABLET | Freq: Every day | ORAL | Status: DC
  Administered 2020-09-05 – 2020-09-06 (×2): 5 mg via ORAL
  Filled 2020-09-04 (×2): qty 1

## 2020-09-04 MED ORDER — SODIUM CHLORIDE 0.9 % IV SOLN: INTRAVENOUS | Status: DC | PRN

## 2020-09-04 MED ORDER — CHLORHEXIDINE GLUCONATE 0.12 % MT SOLN
15.0000 mL | Freq: Once | OROMUCOSAL | Status: AC
  Administered 2020-09-04: 10:00:00 15 mL via OROMUCOSAL
  Filled 2020-09-04: qty 15

## 2020-09-04 MED ORDER — FENTANYL CITRATE (PF) 100 MCG/2ML IJ SOLN
INTRAMUSCULAR | Status: AC
  Administered 2020-09-04: 10:00:00 50 ug via INTRAVENOUS
  Filled 2020-09-04: qty 2

## 2020-09-04 MED ORDER — DEXAMETHASONE SODIUM PHOSPHATE 10 MG/ML IJ SOLN
INTRAMUSCULAR | Status: AC
  Filled 2020-09-04: qty 1

## 2020-09-04 MED ORDER — NALOXONE HCL 0.4 MG/ML IJ SOLN: 0.4000 mg | INTRAMUSCULAR | Status: DC | PRN

## 2020-09-04 MED ORDER — MIDAZOLAM HCL 5 MG/5ML IJ SOLN
INTRAMUSCULAR | Status: DC | PRN
  Administered 2020-09-04 (×2): 1 mg via INTRAVENOUS

## 2020-09-04 MED ORDER — DOCUSATE SODIUM 100 MG PO CAPS
100.0000 mg | ORAL_CAPSULE | Freq: Two times a day (BID) | ORAL | Status: DC
  Administered 2020-09-04 – 2020-09-06 (×4): 100 mg via ORAL
  Filled 2020-09-04 (×4): qty 1

## 2020-09-04 MED ORDER — LIDOCAINE 2% (20 MG/ML) 5 ML SYRINGE
INTRAMUSCULAR | Status: DC | PRN
  Administered 2020-09-04: 60 mg via INTRAVENOUS

## 2020-09-04 MED ORDER — ONDANSETRON HCL 4 MG/2ML IJ SOLN
INTRAMUSCULAR | Status: AC
  Filled 2020-09-04: qty 2

## 2020-09-04 MED ORDER — OXYCODONE HCL 5 MG PO TABS: 5.0000 mg | ORAL_TABLET | Freq: Once | ORAL | Status: AC | PRN

## 2020-09-04 MED ORDER — ORAL CARE MOUTH RINSE: 15.0000 mL | Freq: Once | OROMUCOSAL | Status: AC

## 2020-09-04 MED ORDER — SUGAMMADEX SODIUM 200 MG/2ML IV SOLN
INTRAVENOUS | Status: DC | PRN
  Administered 2020-09-04: 200 mg via INTRAVENOUS

## 2020-09-04 MED ORDER — ENOXAPARIN SODIUM 40 MG/0.4ML ~~LOC~~ SOLN
40.0000 mg | SUBCUTANEOUS | Status: DC
  Administered 2020-09-05 – 2020-09-06 (×2): 40 mg via SUBCUTANEOUS
  Filled 2020-09-04 (×2): qty 0.4

## 2020-09-04 MED ORDER — MIDAZOLAM HCL 2 MG/2ML IJ SOLN: 2.0000 mg | Freq: Once | INTRAMUSCULAR | Status: AC

## 2020-09-04 MED ORDER — FENTANYL CITRATE (PF) 100 MCG/2ML IJ SOLN
INTRAMUSCULAR | Status: AC
  Administered 2020-09-04: 13:00:00 50 ug via INTRAVENOUS
  Filled 2020-09-04: qty 2

## 2020-09-04 MED ORDER — ONDANSETRON HCL 4 MG/2ML IJ SOLN
4.0000 mg | Freq: Four times a day (QID) | INTRAMUSCULAR | Status: DC | PRN
  Administered 2020-09-04 – 2020-09-05 (×2): 4 mg via INTRAVENOUS
  Filled 2020-09-04 (×2): qty 2

## 2020-09-04 MED ORDER — BUPIVACAINE HCL (PF) 0.25 % IJ SOLN
INTRAMUSCULAR | Status: AC
  Filled 2020-09-04: qty 30

## 2020-09-04 MED ORDER — PROPOFOL 10 MG/ML IV BOLUS
INTRAVENOUS | Status: DC | PRN
  Administered 2020-09-04: 50 mg via INTRAVENOUS
  Administered 2020-09-04: 150 mg via INTRAVENOUS

## 2020-09-04 MED ORDER — ESMOLOL HCL 100 MG/10ML IV SOLN
INTRAVENOUS | Status: DC | PRN
  Administered 2020-09-04: 30 mg via INTRAVENOUS

## 2020-09-04 MED ORDER — MIDAZOLAM HCL 2 MG/2ML IJ SOLN
INTRAMUSCULAR | Status: AC
  Administered 2020-09-04: 10:00:00 2 mg via INTRAVENOUS
  Filled 2020-09-04: qty 2

## 2020-09-04 MED ORDER — ONDANSETRON HCL 4 MG/2ML IJ SOLN
INTRAMUSCULAR | Status: DC | PRN
  Administered 2020-09-04: 4 mg via INTRAVENOUS

## 2020-09-04 MED ORDER — OXYCODONE HCL 5 MG/5ML PO SOLN
5.0000 mg | Freq: Once | ORAL | Status: AC | PRN
Start: 2020-09-04 — End: 2020-09-04
  Administered 2020-09-04: 5 mg via ORAL

## 2020-09-04 MED ORDER — ROCURONIUM BROMIDE 10 MG/ML (PF) SYRINGE
PREFILLED_SYRINGE | INTRAVENOUS | Status: AC
  Filled 2020-09-04: qty 10

## 2020-09-04 MED ORDER — IBUPROFEN 800 MG PO TABS
800.0000 mg | ORAL_TABLET | Freq: Three times a day (TID) | ORAL | Status: DC
  Administered 2020-09-04 – 2020-09-06 (×5): 800 mg via ORAL
  Filled 2020-09-04 (×5): qty 1

## 2020-09-04 MED ORDER — LABETALOL HCL 5 MG/ML IV SOLN: 5.0000 mg | Freq: Once | INTRAVENOUS | Status: AC

## 2020-09-04 MED ORDER — ONDANSETRON HCL 4 MG/2ML IJ SOLN: 4.0000 mg | Freq: Four times a day (QID) | INTRAMUSCULAR | Status: DC | PRN

## 2020-09-04 MED ORDER — INDIGOTINDISULFONATE SODIUM 8 MG/ML IJ SOLN
40.0000 mg | Freq: Once | INTRAMUSCULAR | Status: AC
  Administered 2020-09-04: 12:00:00 40 mg via INTRAVENOUS
  Filled 2020-09-04: qty 5

## 2020-09-04 MED ORDER — DEXAMETHASONE SODIUM PHOSPHATE 4 MG/ML IJ SOLN
INTRAMUSCULAR | Status: DC | PRN
  Administered 2020-09-04: 10 mg via INTRAVENOUS

## 2020-09-04 MED ORDER — HYDROMORPHONE 1 MG/ML IV SOLN
INTRAVENOUS | Status: DC
Start: 2020-09-04 — End: 2020-09-05
  Administered 2020-09-04: 2.7 mL via INTRAVENOUS
  Administered 2020-09-04: 4.4 mg via INTRAVENOUS
  Administered 2020-09-05: 1.5 mg via INTRAVENOUS
  Administered 2020-09-05 (×2): 1.2 mg via INTRAVENOUS

## 2020-09-04 MED ORDER — FENTANYL CITRATE (PF) 100 MCG/2ML IJ SOLN
INTRAMUSCULAR | Status: DC | PRN
  Administered 2020-09-04 (×3): 50 ug via INTRAVENOUS

## 2020-09-04 MED ORDER — FENTANYL CITRATE (PF) 250 MCG/5ML IJ SOLN
INTRAMUSCULAR | Status: AC
  Filled 2020-09-04: qty 5

## 2020-09-04 MED ORDER — FENTANYL CITRATE (PF) 100 MCG/2ML IJ SOLN
INTRAMUSCULAR | Status: AC
  Filled 2020-09-04: qty 2

## 2020-09-04 MED ORDER — BUPIVACAINE HCL (PF) 0.25 % IJ SOLN
INTRAMUSCULAR | Status: DC | PRN
  Administered 2020-09-04: 20 mL

## 2020-09-04 MED ORDER — DIPHENHYDRAMINE HCL 50 MG/ML IJ SOLN: 12.5000 mg | Freq: Four times a day (QID) | INTRAMUSCULAR | Status: DC | PRN

## 2020-09-04 MED ORDER — GABAPENTIN 100 MG PO CAPS
100.0000 mg | ORAL_CAPSULE | Freq: Three times a day (TID) | ORAL | Status: DC
  Administered 2020-09-04 – 2020-09-06 (×5): 100 mg via ORAL
  Filled 2020-09-04 (×5): qty 1

## 2020-09-04 MED ORDER — OXYCODONE HCL 5 MG/5ML PO SOLN
ORAL | Status: AC
  Filled 2020-09-04: qty 5

## 2020-09-04 MED ORDER — CEFAZOLIN SODIUM-DEXTROSE 2-4 GM/100ML-% IV SOLN
2.0000 g | INTRAVENOUS | Status: AC
  Administered 2020-09-04: 10:00:00 2 g via INTRAVENOUS
  Filled 2020-09-04: qty 100

## 2020-09-04 MED ORDER — ROCURONIUM BROMIDE 10 MG/ML (PF) SYRINGE
PREFILLED_SYRINGE | INTRAVENOUS | Status: DC | PRN
  Administered 2020-09-04 (×2): 20 mg via INTRAVENOUS
  Administered 2020-09-04: 60 mg via INTRAVENOUS
  Administered 2020-09-04: 20 mg via INTRAVENOUS

## 2020-09-04 MED ORDER — ALBUMIN HUMAN 5 % IV SOLN: INTRAVENOUS | Status: DC | PRN

## 2020-09-04 MED ORDER — MIDAZOLAM HCL 2 MG/2ML IJ SOLN
INTRAMUSCULAR | Status: AC
  Filled 2020-09-04: qty 2

## 2020-09-04 MED ORDER — DIPHENHYDRAMINE HCL 12.5 MG/5ML PO ELIX: 12.5000 mg | ORAL_SOLUTION | Freq: Four times a day (QID) | ORAL | Status: DC | PRN

## 2020-09-04 MED ORDER — LABETALOL HCL 5 MG/ML IV SOLN
INTRAVENOUS | Status: AC
  Administered 2020-09-04: 14:00:00 5 mg via INTRAVENOUS
  Filled 2020-09-04: qty 4

## 2020-09-04 MED ORDER — ONDANSETRON HCL 4 MG/2ML IJ SOLN
4.0000 mg | Freq: Once | INTRAMUSCULAR | Status: AC | PRN
  Administered 2020-09-04: 13:00:00 4 mg via INTRAVENOUS

## 2020-09-04 MED ORDER — METOPROLOL TARTRATE 5 MG/5ML IV SOLN
INTRAVENOUS | Status: DC | PRN
  Administered 2020-09-04: 2 mg via INTRAVENOUS

## 2020-09-04 MED ORDER — SODIUM CHLORIDE 0.9% FLUSH: 9.0000 mL | INTRAVENOUS | Status: DC | PRN

## 2020-09-04 MED ORDER — FENTANYL CITRATE (PF) 100 MCG/2ML IJ SOLN
25.0000 ug | INTRAMUSCULAR | Status: DC | PRN
  Administered 2020-09-04 (×2): 50 ug via INTRAVENOUS

## 2020-09-04 MED ORDER — HYDROMORPHONE 1 MG/ML IV SOLN
INTRAVENOUS | Status: AC
  Administered 2020-09-04: 30 mg via INTRAVENOUS
  Filled 2020-09-04: qty 30

## 2020-09-04 MED ORDER — LIDOCAINE 2% (20 MG/ML) 5 ML SYRINGE
INTRAMUSCULAR | Status: AC
  Filled 2020-09-04: qty 5

## 2020-09-04 SURGICAL SUPPLY — 35 items
CANISTER SUCT 3000ML PPV (MISCELLANEOUS) ×4 IMPLANT
COVER WAND RF STERILE (DRAPES) ×4 IMPLANT
DRAPE CESAREAN BIRTH W POUCH (DRAPES) ×4 IMPLANT
DRSG OPSITE POSTOP 4X10 (GAUZE/BANDAGES/DRESSINGS) ×4 IMPLANT
DURAPREP 26ML APPLICATOR (WOUND CARE) ×4 IMPLANT
GAUZE 4X4 16PLY RFD (DISPOSABLE) ×4 IMPLANT
GAUZE SPONGE 4X4 12PLY STRL LF (GAUZE/BANDAGES/DRESSINGS) ×4 IMPLANT
GLOVE BIOGEL PI IND STRL 6.5 (GLOVE) ×2 IMPLANT
GLOVE BIOGEL PI IND STRL 7.0 (GLOVE) ×4 IMPLANT
GLOVE BIOGEL PI INDICATOR 6.5 (GLOVE) ×2
GLOVE BIOGEL PI INDICATOR 7.0 (GLOVE) ×4
GLOVE SURG SS PI 6.5 STRL IVOR (GLOVE) ×4 IMPLANT
GOWN STRL REUS W/ TWL LRG LVL3 (GOWN DISPOSABLE) ×6 IMPLANT
GOWN STRL REUS W/TWL LRG LVL3 (GOWN DISPOSABLE) ×12
HIBICLENS CHG 4% 4OZ BTL (MISCELLANEOUS) ×4 IMPLANT
KIT TURNOVER KIT B (KITS) ×4 IMPLANT
NEEDLE HYPO 22GX1.5 SAFETY (NEEDLE) ×4 IMPLANT
NS IRRIG 1000ML POUR BTL (IV SOLUTION) ×4 IMPLANT
PACK ABDOMINAL GYN (CUSTOM PROCEDURE TRAY) ×4 IMPLANT
PAD ABD 8X7 1/2 STERILE (GAUZE/BANDAGES/DRESSINGS) ×4 IMPLANT
PAD ARMBOARD 7.5X6 YLW CONV (MISCELLANEOUS) ×4 IMPLANT
PAD OB MATERNITY 4.3X12.25 (PERSONAL CARE ITEMS) ×4 IMPLANT
SPONGE LAP 18X18 RF (DISPOSABLE) ×4 IMPLANT
SUT VIC AB 0 CT1 18XCR BRD8 (SUTURE) ×6 IMPLANT
SUT VIC AB 0 CT1 27 (SUTURE) ×8
SUT VIC AB 0 CT1 27XBRD ANBCTR (SUTURE) ×4 IMPLANT
SUT VIC AB 0 CT1 36 (SUTURE) ×8 IMPLANT
SUT VIC AB 0 CT1 8-18 (SUTURE) ×12
SUT VIC AB 2-0 CT1 27 (SUTURE) ×4
SUT VIC AB 2-0 CT1 TAPERPNT 27 (SUTURE) ×2 IMPLANT
SUT VIC AB 4-0 KS 27 (SUTURE) ×4 IMPLANT
SUT VICRYL 0 TIES 12 18 (SUTURE) ×4 IMPLANT
SYR CONTROL 10ML LL (SYRINGE) ×4 IMPLANT
TOWEL GREEN STERILE FF (TOWEL DISPOSABLE) ×8 IMPLANT
TRAY FOLEY W/BAG SLVR 14FR (SET/KITS/TRAYS/PACK) ×4 IMPLANT

## 2020-09-04 NOTE — Anesthesia Procedure Notes (Signed)
Anesthesia Regional Block: TAP block   Pre-Anesthetic Checklist: ,, timeout performed, Correct Patient, Correct Site, Correct Laterality, Correct Procedure, Correct Position, site marked, Risks and benefits discussed, pre-op evaluation,  At surgeon's request and post-op pain management  Laterality: Right  Prep: Maximum Sterile Barrier Precautions used, chloraprep       Needles:  Injection technique: Single-shot  Needle Type: Echogenic Stimulator Needle     Needle Length: 9cm  Needle Gauge: 21     Additional Needles:   Narrative:  Start time: 09/04/2020 9:40 AM End time: 09/04/2020 9:50 AM Injection made incrementally with aspirations every 5 mL. Anesthesiologist: Roberts Gaudy, MD  Additional Notes: 30 cc 0.25% Bupivacaine with 1:200 epi

## 2020-09-04 NOTE — Anesthesia Procedure Notes (Signed)
Anesthesia Regional Block: TAP block   Pre-Anesthetic Checklist: ,, timeout performed, Correct Patient, Correct Site, Correct Laterality, Correct Procedure, Correct Position, site marked, Risks and benefits discussed, pre-op evaluation,  At surgeon's request and post-op pain management  Laterality: Left  Prep: Maximum Sterile Barrier Precautions used, chloraprep       Needles:  Injection technique: Single-shot  Needle Type: Echogenic Stimulator Needle     Needle Length: 9cm  Needle Gauge: 21     Additional Needles:   Narrative:  Start time: 09/04/2020 9:50 AM End time: 09/04/2020 9:55 AM Injection made incrementally with aspirations every 5 mL.  Performed by: Personally  Anesthesiologist: Roberts Gaudy, MD  Additional Notes: 30 cc 5 West Progression Recent Vital Signs  LMP 08/31/2020    Past Medical History: No date: Anemia No date: Hypertension   Expected Discharge Date    Diet Order    None        VTE Documentation     Work Intensity Score/Level of Care    @LEVELOFCARE @   Mobility   25 cc.25% Bupivacaine with 1:200 epi injected easily      DC Barriers  Abnormal Labs:  Tracy [name] 09/04/2020, 2:22 PM % Bupivacaine

## 2020-09-04 NOTE — OR Nursing (Signed)
pca in place and infusing to floor

## 2020-09-04 NOTE — OR Nursing (Signed)
Pt is awake,alert and oriented.Pt and/or family verbalized understanding of poc and discharge instructions. Reviewed admission and on going care with receiving RN. Pt is in NAD at this time and is ready to be transferred to floor. Will con't to monitor until pt is transferred. Belongings on bed with patient PCA withpatient and infusing prior to leaving unit

## 2020-09-04 NOTE — Transfer of Care (Signed)
Immediate Anesthesia Transfer of Care Note  Patient: Anallely Rosell  Procedure(s) Performed: HYSTERECTOMY ABDOMINAL WITH SALPINGECTOMY (N/A Abdomen) CYSTOSCOPY (N/A Urethra)  Patient Location: PACU  Anesthesia Type:General  Level of Consciousness: awake and alert   Airway & Oxygen Therapy: Patient Spontanous Breathing and Patient connected to face mask oxygen  Post-op Assessment: Report given to RN and Post -op Vital signs reviewed and stable  Post vital signs: Reviewed and stable  Last Vitals:  Vitals Value Taken Time  BP 131/93 09/04/20 1253  Temp    Pulse 98 09/04/20 1253  Resp 17 09/04/20 1253  SpO2 100 % 09/04/20 1253  Vitals shown include unvalidated device data.  Last Pain:  Vitals:   09/04/20 0836  TempSrc: Oral      Patients Stated Pain Goal: 3 (09/09/74 8832)  Complications: No complications documented.

## 2020-09-04 NOTE — Op Note (Signed)
Tracy [name] PROCEDURE DATE: 09/04/2020  PREOPERATIVE DIAGNOSIS:  39 y.o. G2P2001 with symptomatic fibroids, menorrhagia POSTOPERATIVE DIAGNOSIS:  Same SURGEON:   Mora Bellman, M.D. ASSISTANT:   Dr. Roselie Awkward OPERATION:  Total abdominal hysterectomy, Bilateral Salpingectomy, cystoscopy ANESTHESIA:  General endotracheal.  INDICATIONS: The patient is a 39 y.o. G2P2001 with history of symptomatic uterine fibroids/menorrhagia. The patient made a decision to undergo definite surgical treatment. On the preoperative visit, the risks, benefits, indications, and alternatives of the procedure were reviewed with the patient.  On the day of surgery, the risks of surgery were again discussed with the patient including but not limited to: bleeding which may require transfusion or reoperation; infection which may require antibiotics; injury to bowel, bladder, ureters or other surrounding organs; need for additional procedures; thromboembolic phenomenon, incisional problems and other postoperative/anesthesia complications. Written informed consent was obtained.    OPERATIVE FINDINGS: An 18-week size fibroid uterus with normal tubes and ovaries bilaterally.  ESTIMATED BLOOD LOSS: 650 ml FLUIDS:  1300 ml of Lactated Ringers, 2 u pRBC, 250 ml albumin URINE OUTPUT:  450 ml of clear yellow urine. SPECIMENS:  Uterus and tubes sent to pathology COMPLICATIONS:  None immediate.   DESCRIPTION OF PROCEDURE: The patient received intravenous antibiotics and had sequential compression devices applied to her lower extremities while in the preoperative area.   She was taken to the operating room where anesthesia was induced and found to be adequate. The patient was placed in supine position. The abdomen and perineum were prepped and draped in a sterile manner, and a Foley catheter was inserted into the bladder and attached to Tracy [name] drainage. After an adequate timeout was performed, a Pfannensteil skin incision was made.  This incision was taken down to the fascia using electrocautery with care given to maintain good hemostasis. The fascia was incised in the midline and the fascial incision was then extended bilaterally using mayo scissors. The fascia was then dissected off the underlying rectus muscles using blunt and sharp dissection. The rectus muscles were split bluntly in the midline and the peritoneum entered sharply without complication. This peritoneal incision was then extended superiorly and inferiorly with care given to prevent bowel or bladder injury. Upon entry into the abdominal cavity, the upper abdomen was inspected and found to be normal. Attention was then turned to the pelvis. A retractor was placed into the incision, and the bowel was packed away with moist laparotomy sponges. The uterus at this point was noted to be mobilized. The round ligaments on each side were clamped, suture ligated, and transected with electrocautery allowing entry into the broad ligament. The anterior and posterior leaves of the broad ligament were separated and the ureters were inspected to be safely away from the area of dissection bilaterally. A hole was created in the clear portion of the posterior broad ligament. Adnexae were clamped on the patient's right side. This pedicle was then clamped, cut, and doubly suture ligated with good hemostasis. This procedure was repeated in an identical fashion on the left site allowing for both adnexa to remain in place.  A bladder flap was then created across the anterior leaf of the broad ligament and the bladder was then bluntly dissected off the lower uterine segment and cervix with good hemostasis. The uterine arteries were then skeletonized bilaterally and then clamped, cut, and ligated with care given to prevent ureteral injury. The uterosacral ligaments were then clamped, cut, and ligated bilaterally. Finally, the cardinal ligaments were clamped, cut, and ligated bilaterally.  Acutely curved  clamps were placed across the vagina just under the cervix, and the specimen was amputated and sent to pathology. The vaginal cuff was closed with a series of interrupted 0 Vicryl figure-of-eight sutures with care given to incorporate the anterior pubocervical fascia and the posterior rectovaginal fascia.  The vaginal cuff angles were noted to have good hemostasis.  Babcock clamps were used to grasped the right fallopian tube, a Kelly clamps was placed along the mesosalpinx and the tube was transected and tied. A similar process was performed on the left fallopian tube allowing for bilateral salpingectomy. The pelvis was irrigated and hemostasis was reconfirmed at all pedicles and along the pelvic sidewall.  All laparotomy sponges and instruments were removed from the abdomen. Arista was applied over the vaginal cuff and all pedicles. The peritoneum was closed with 2-0 Vicryl, and the fascia was closed with 0 Vicryl in a running fashion. The skin was closed with a 4-0 Monocryl subcuticular stitch. The 70 degree hysteroscope was introduced into the urethra. After the admnistration of methylene blue, bilateral ureteral jets were visualized. Sponge, lap, needle, and instrument counts were correct times two. The patient was taken to the recovery area awake, extubated and in stable condition.  An experienced assistant was required given the standard of surgical care given the complexity of the case.  This assistant was needed for exposure, dissection, suctioning, retraction, instrument exchange, and for overall help during the procedure.

## 2020-09-04 NOTE — Anesthesia Procedure Notes (Signed)
Procedure Name: Intubation Date/Time: 09/04/2020 10:08 AM Performed by: Eulas Post, Dahl Higinbotham W, CRNA Pre-anesthesia Checklist: Patient identified, Emergency Drugs available, Suction available and Patient being monitored Patient Re-evaluated:Patient Re-evaluated prior to induction Oxygen Delivery Method: Circle system utilized Preoxygenation: Pre-oxygenation with 100% oxygen Induction Type: IV induction Ventilation: Mask ventilation without difficulty Laryngoscope Size: Miller and 2 Grade View: Grade I Tube type: Oral Tube size: 7.0 mm Number of attempts: 1 Airway Equipment and Method: Stylet Placement Confirmation: ETT inserted through vocal cords under direct vision,  positive ETCO2 and breath sounds checked- equal and bilateral Secured at: 22 cm Tube secured with: Tape Dental Injury: Teeth and Oropharynx as per pre-operative assessment

## 2020-09-05 ENCOUNTER — Encounter (HOSPITAL_COMMUNITY): Payer: Self-pay | Admitting: Obstetrics and Gynecology

## 2020-09-05 LAB — BPAM RBC
Blood Product Expiration Date: 202201032359
Blood Product Expiration Date: 202201032359
ISSUE DATE / TIME: 202112141106
ISSUE DATE / TIME: 202112141106
Unit Type and Rh: 5100
Unit Type and Rh: 5100

## 2020-09-05 LAB — TYPE AND SCREEN
ABO/RH(D): O POS
Antibody Screen: NEGATIVE
Unit division: 0
Unit division: 0

## 2020-09-05 LAB — CBC
HCT: 30.3 % — ABNORMAL LOW (ref 36.0–46.0)
Hemoglobin: 10 g/dL — ABNORMAL LOW (ref 12.0–15.0)
MCH: 26.2 pg (ref 26.0–34.0)
MCHC: 33 g/dL (ref 30.0–36.0)
MCV: 79.3 fL — ABNORMAL LOW (ref 80.0–100.0)
Platelets: 327 10*3/uL (ref 150–400)
RBC: 3.82 MIL/uL — ABNORMAL LOW (ref 3.87–5.11)
RDW: 19.2 % — ABNORMAL HIGH (ref 11.5–15.5)
WBC: 9.2 10*3/uL (ref 4.0–10.5)
nRBC: 0 % (ref 0.0–0.2)

## 2020-09-05 MED ORDER — OXYCODONE-ACETAMINOPHEN 5-325 MG PO TABS
1.0000 | ORAL_TABLET | Freq: Four times a day (QID) | ORAL | Status: DC | PRN
  Administered 2020-09-05 – 2020-09-06 (×3): 2 via ORAL
  Filled 2020-09-05 (×3): qty 2

## 2020-09-05 NOTE — Discharge Summary (Signed)
Physician Discharge Summary  Patient ID: Tracy [name] MRN: [id number] DOB/AGE: 1981-08-02 39 y.o.  Admit date: 09/04/2020 Discharge date: 09/06/2020  Admission Diagnoses:  Discharge Diagnoses:  Active Problems:   S/P abdominal hysterectomy   Discharged Condition: good  Hospital Course: 39 yo with symptomatic fibroid uterus admitted for scheduled hysterectomy for definitive treatment. Patient underwent a TAH with bilateral salpingectomy without complications. Her post op course remained uncomplicated. She tolerated a regular diet, ambulated, voided. Patient found stable for discharge on POD#2. Discharge precautions were reviewed with the patient.   Discharge Exam: Blood pressure 139/90, pulse 88, temperature 98.7 F (37.1 C), temperature source Oral, resp. rate 18, height 5\' 9"  (1.753 m), weight 92.5 kg, last menstrual period 08/31/2020, SpO2 97 %. General appearance: alert, cooperative and no distress Resp: clear to auscultation bilaterally Cardio: regular rate and rhythm GI: soft, appropriately tender, non distended Extremities: extremities normal, atraumatic, no cyanosis or edema and Homans sign is negative, no sign of DVT Incision/Wound:honeycomb dressing clean dry and intact  Disposition: Home   Allergies as of 09/06/2020   No Known Allergies     Medication List    TAKE these medications   amLODipine 5 MG tablet Commonly known as: NORVASC Take 5 mg by mouth daily.   BC FAST PAIN RELIEF PO Take 1 packet by mouth daily as needed (pain).   docusate sodium 100 MG capsule Commonly known as: COLACE Take 1 capsule (100 mg total) by mouth 2 (two) times daily.   ibuprofen 600 MG tablet Commonly known as: ADVIL Take 1 tablet (600 mg total) by mouth every 6 (six) hours as needed. What changed:   medication strength  how much to take  when to take this  reasons to take this   IRON PO Take 2 tablets by mouth daily.   LUBRICATING EYE DROPS OP Place 1 drop  into both eyes daily as needed (dry eyes).   oxyCODONE-acetaminophen 5-325 MG tablet Commonly known as: PERCOCET/ROXICET Take 1 tablet by mouth every 6 (six) hours as needed for moderate pain or severe pain.   triamcinolone 0.1 % Commonly known as: KENALOG Apply 1 application topically 2 (two) times daily as needed for rash.       Follow-up Information    [facility].   Why: An appointment will be made for you to follow up in 4 weeks Contact information: [address] [place] [address] [phone]              Signed: Mora Bellman 09/06/2020, 9:13 AM

## 2020-09-05 NOTE — Discharge Instructions (Signed)
Abdominal Hysterectomy, Care After This sheet gives you information about how to care for yourself after your procedure. Your health care provider may also give you more specific instructions. If you have problems or questions, contact your health care provider. What can I expect after the procedure? After your procedure, it is common to have:  Pain.  Fatigue.  Poor appetite.  Less interest in sex.  Vaginal bleeding and discharge. You may need to use a sanitary napkin after this procedure. Follow these instructions at home: Bathing  Do not take baths, swim, or use a hot tub until your health care provider approves. Ask your health care provider if you can take showers. You may only be allowed to take sponge baths for bathing.  Keep the bandage (dressing) dry until your health care provider says it can be removed. Incision care   Follow instructions from your health care provider about how to take care of your incision. Make sure you: ? Wash your hands with soap and water before you change your bandage (dressing). If soap and water are not available, use hand sanitizer. ? Change your dressing as told by your health care provider. ? Leave stitches (sutures), skin glue, or adhesive strips in place. These skin closures may need to stay in place for 2 weeks or longer. If adhesive strip edges start to loosen and curl up, you may trim the loose edges. Do not remove adhesive strips completely unless your health care provider tells you to do that.  Check your incision area every day for signs of infection. Check for: ? Redness, swelling, or pain. ? Fluid or blood. ? Warmth. ? Pus or a bad smell. Activity  Do gentle, daily exercises as told by your health care provider. You may be told to take short walks every day and go farther each time.  Do not lift anything that is heavier than 10 lb (4.5 kg), or the limit that your health care provider tells you, until he or she says that it is  safe.  Do not drive or use heavy machinery while taking prescription pain medicine.  Do not drive for 24 hours if you were given a medicine to help you relax (sedative).  Follow your health care provider's instructions about exercise, driving, and general activities. Ask your health care provider what activities are safe for you. Lifestyle  Do not douche, use tampons, or have sex for at least 6 weeks or as told by your health care provider.  Do not drink alcohol until your health care provider approves.  Drink enough fluid to keep your urine clear or pale yellow.  Try to have someone at home with you for the first 1-2 weeks to help.  Do not use any products that contain nicotine or tobacco, such as cigarettes and e-cigarettes. These can delay healing. If you need help quitting, ask your health care provider. General instructions  Take over-the-counter and prescription medicines only as told by your health care provider.  Do not take aspirin or ibuprofen. These medicines can cause bleeding.  To prevent or treat constipation while you are taking prescription pain medicine, your health care provider may recommend that you: ? Drink enough fluid to keep your urine clear or pale yellow. ? Take over-the-counter or prescription medicines. ? Eat foods that are high in fiber, such as fresh fruits and vegetables, whole grains, and beans. ? Limit foods that are high in fat and processed sugars, such as fried and sweet foods.  Keep all  follow-up visits as told by your health care provider. This is important. Contact a health care provider if:  You have chills or fever.  You have redness, swelling, or pain around your incision.  You have fluid or blood coming from your incision.  Your incision feels warm to the touch.  You have pus or a bad smell coming from your incision.  Your incision breaks open.  You feel dizzy or light-headed.  You have pain or bleeding when you urinate.  You  have persistent diarrhea.  You have persistent nausea and vomiting.  You have abnormal vaginal discharge.  You have a rash.  You have any type of abnormal reaction or you develop an allergy to your medicine.  Your pain medicine does not help. Get help right away if:  You have a fever and your symptoms suddenly get worse.  You have severe abdominal pain.  You have shortness of breath.  You faint.  You have pain, swelling, or redness in your leg.  You have heavy vaginal bleeding with blood clots. Summary  After your procedure, it is common to have pain, fatigue and vaginal discharge.  Do not take baths, swim, or use a hot tub until your health care provider approves. Ask your health care provider if you can take showers. You may only be allowed to take sponge baths for bathing.  Follow your health care provider's instructions about exercise, driving, and general activities. Ask your health care provider what activities are safe for you.  Do not lift anything that is heavier than 10 lb (4.5 kg), or the limit that your health care provider tells you, until he or she says that it is safe.  Try to have someone at home with you for the first 1-2 weeks to help. This information is not intended to replace advice given to you by your health care provider. Make sure you discuss any questions you have with your health care provider. Document Revised: 10/12/2018 Document Reviewed: 08/27/2016 Elsevier Patient Education  [employer].

## 2020-09-05 NOTE — Progress Notes (Signed)
1 Day Post-Op Procedure(s) (LRB): HYSTERECTOMY ABDOMINAL WITH SALPINGECTOMY (N/A) CYSTOSCOPY (N/A)  Subjective: Patient reports incisional pain and tolerating PO. Patient ambulated yesterday without difficulty    Objective: I have reviewed patient's vital signs and intake and output.  General: alert, cooperative and no distress Resp: clear to auscultation bilaterally Cardio: regular rate and rhythm GI: normal findings: bowel sounds normal and soft, appropriately tender and incision: honeycomb dressing clean dry and intact Extremities: Homans sign is negative, no sign of DVT and no edema, redness or tenderness in the calves or thighs  Assessment: s/p Procedure(s): HYSTERECTOMY ABDOMINAL WITH SALPINGECTOMY (N/A) CYSTOSCOPY (N/A): stable, progressing well and tolerating diet  Plan: Encourage ambulation Advance to PO medication Discontinue IV fluids  LOS: 1 day    Abdulaziz Toman 09/05/2020, 7:24 AM

## 2020-09-06 LAB — SURGICAL PATHOLOGY

## 2020-09-06 MED ORDER — IBUPROFEN 600 MG PO TABS: 600.0000 mg | ORAL_TABLET | Freq: Four times a day (QID) | ORAL | 3 refills | Status: DC | PRN

## 2020-09-06 MED ORDER — OXYCODONE-ACETAMINOPHEN 5-325 MG PO TABS
1.0000 | ORAL_TABLET | Freq: Four times a day (QID) | ORAL | 0 refills | Status: DC | PRN
Start: 2020-09-06 — End: 2020-09-26

## 2020-09-06 MED ORDER — DOCUSATE SODIUM 100 MG PO CAPS: 100.0000 mg | ORAL_CAPSULE | Freq: Two times a day (BID) | ORAL | 0 refills | Status: DC

## 2020-09-06 NOTE — Anesthesia Postprocedure Evaluation (Signed)
Anesthesia Post Note  Patient: Tracy [name]  Procedure(s) Performed: HYSTERECTOMY ABDOMINAL WITH SALPINGECTOMY (N/A Abdomen) CYSTOSCOPY (N/A Urethra)     Patient location during evaluation: PACU Anesthesia Type: General Level of consciousness: awake and alert Pain management: pain level controlled Vital Signs Assessment: post-procedure vital signs reviewed and stable Respiratory status: spontaneous breathing, nonlabored ventilation, respiratory function stable and patient connected to nasal cannula oxygen Cardiovascular status: blood pressure returned to baseline and stable Postop Assessment: no apparent nausea or vomiting Anesthetic complications: no   No complications documented.  Last Vitals:  Vitals:   09/06/20 0403 09/06/20 0759  BP: 134/80 139/90  Pulse: 96 88  Resp: 17 18  Temp: 36.9 C 37.1 C  SpO2: 100% 97%    Last Pain:  Vitals:   09/06/20 0817  TempSrc:   PainSc: 0-No pain                 Nuchem Grattan COKER

## 2020-09-06 NOTE — Progress Notes (Signed)
Reviewed discharge post op instructions with patient regarding medications, when to call MD/go to hospital, signs and symptoms of infection, surgical site care, and to follow up with OB in 4 weeks. Pt already scheduled for post op appointment. Pt verbalized understanding of discharge instructions and asked appropriate questions. Pt left ambulatory and daughter came to pick up.

## 2020-09-17 ENCOUNTER — Encounter (HOSPITAL_COMMUNITY): Payer: Self-pay | Admitting: Obstetrics and Gynecology

## 2020-09-17 ENCOUNTER — Emergency Department (HOSPITAL_COMMUNITY)
Admission: EM | Admit: 2020-09-17 | Discharge: 2020-09-17 | Discharge status: Against Medical Advice | Payer: Medicaid Other | Source: Home / Self Care

## 2020-09-17 ENCOUNTER — Emergency Department (HOSPITAL_COMMUNITY)
Admission: EM | Admit: 2020-09-17 | Discharge: 2020-09-17 | Disposition: A | Discharge status: Home / Self Care | Payer: Medicaid Other | Attending: Emergency Medicine | Admitting: Emergency Medicine

## 2020-09-17 ENCOUNTER — Other Ambulatory Visit: Payer: Self-pay

## 2020-09-17 DIAGNOSIS — Z5321 Procedure and treatment not carried out due to patient leaving prior to being seen by health care provider: Secondary | ICD-10-CM | POA: Insufficient documentation

## 2020-09-17 DIAGNOSIS — R109 Unspecified abdominal pain: Secondary | ICD-10-CM | POA: Diagnosis present

## 2020-09-17 LAB — CBC WITH DIFFERENTIAL/PLATELET
Abs Immature Granulocytes: 0.03 10*3/uL (ref 0.00–0.07)
Basophils Absolute: 0 10*3/uL (ref 0.0–0.1)
Basophils Relative: 0 %
Eosinophils Absolute: 0.3 10*3/uL (ref 0.0–0.5)
Eosinophils Relative: 3 %
HCT: 34.9 % — ABNORMAL LOW (ref 36.0–46.0)
Hemoglobin: 10.8 g/dL — ABNORMAL LOW (ref 12.0–15.0)
Immature Granulocytes: 0 %
Lymphocytes Relative: 17 %
Lymphs Abs: 1.7 10*3/uL (ref 0.7–4.0)
MCH: 26 pg (ref 26.0–34.0)
MCHC: 30.9 g/dL (ref 30.0–36.0)
MCV: 84.1 fL (ref 80.0–100.0)
Monocytes Absolute: 0.5 10*3/uL (ref 0.1–1.0)
Monocytes Relative: 5 %
Neutro Abs: 7.4 10*3/uL (ref 1.7–7.7)
Neutrophils Relative %: 75 %
Platelets: 512 10*3/uL — ABNORMAL HIGH (ref 150–400)
RBC: 4.15 MIL/uL (ref 3.87–5.11)
RDW: 18.7 % — ABNORMAL HIGH (ref 11.5–15.5)
WBC: 9.9 10*3/uL (ref 4.0–10.5)
nRBC: 0 % (ref 0.0–0.2)

## 2020-09-17 LAB — BASIC METABOLIC PANEL
Anion gap: 10 (ref 5–15)
BUN: 9 mg/dL (ref 6–20)
CO2: 23 mmol/L (ref 22–32)
Calcium: 9.4 mg/dL (ref 8.9–10.3)
Chloride: 102 mmol/L (ref 98–111)
Creatinine, Ser: 1.3 mg/dL — ABNORMAL HIGH (ref 0.44–1.00)
GFR, Estimated: 54 mL/min — ABNORMAL LOW (ref >60)
Glucose, Bld: 105 mg/dL — ABNORMAL HIGH (ref 70–99)
Potassium: 3.9 mmol/L (ref 3.5–5.1)
Sodium: 135 mmol/L (ref 135–145)

## 2020-09-17 LAB — CBC
HCT: 34.3 % — ABNORMAL LOW (ref 36.0–46.0)
Hemoglobin: 10.3 g/dL — ABNORMAL LOW (ref 12.0–15.0)
MCH: 24.9 pg — ABNORMAL LOW (ref 26.0–34.0)
MCHC: 30 g/dL (ref 30.0–36.0)
MCV: 83.1 fL (ref 80.0–100.0)
Platelets: 481 10*3/uL — ABNORMAL HIGH (ref 150–400)
RBC: 4.13 MIL/uL (ref 3.87–5.11)
RDW: 18.7 % — ABNORMAL HIGH (ref 11.5–15.5)
WBC: 7.7 10*3/uL (ref 4.0–10.5)
nRBC: 0 % (ref 0.0–0.2)

## 2020-09-17 LAB — COMPREHENSIVE METABOLIC PANEL
ALT: 18 U/L (ref 0–44)
AST: 18 U/L (ref 15–41)
Albumin: 4 g/dL (ref 3.5–5.0)
Alkaline Phosphatase: 69 U/L (ref 38–126)
Anion gap: 9 (ref 5–15)
BUN: 11 mg/dL (ref 6–20)
CO2: 26 mmol/L (ref 22–32)
Calcium: 9.5 mg/dL (ref 8.9–10.3)
Chloride: 105 mmol/L (ref 98–111)
Creatinine, Ser: 1.28 mg/dL — ABNORMAL HIGH (ref 0.44–1.00)
GFR, Estimated: 55 mL/min — ABNORMAL LOW (ref >60)
Glucose, Bld: 105 mg/dL — ABNORMAL HIGH (ref 70–99)
Potassium: 4.5 mmol/L (ref 3.5–5.1)
Sodium: 140 mmol/L (ref 135–145)
Total Bilirubin: 0.5 mg/dL (ref 0.3–1.2)
Total Protein: 7.9 g/dL (ref 6.5–8.1)

## 2020-09-17 MED ORDER — OXYCODONE-ACETAMINOPHEN 5-325 MG PO TABS
1.0000 | ORAL_TABLET | ORAL | Status: DC | PRN
  Administered 2020-09-17: 1 via ORAL
  Filled 2020-09-17: qty 1

## 2020-09-17 NOTE — ED Triage Notes (Signed)
Pt presents to ED POV. Pt c/o abd pain. Pt reports that she had hysterectomy 12/14. Pt states that her pain is getting worse, she is having hot flushes. Pt denies s/s of infection at operation site

## 2020-09-17 NOTE — ED Notes (Signed)
Pt told screeners that she was leaving the department.

## 2020-09-17 NOTE — ED Triage Notes (Signed)
Patient reports she had a hysterectomy on the 14th. Patient reports "flashes of pain" that is not constant. Patient reports the area is sensitive and her naval feels like it is pulling.

## 2020-09-17 NOTE — ED Notes (Signed)
Pt refuse vital recheck

## 2020-09-18 ENCOUNTER — Inpatient Hospital Stay (HOSPITAL_COMMUNITY)
Admission: EM | Admit: 2020-09-18 | Discharge: 2020-09-18 | Disposition: A | Discharge status: Home / Self Care | Payer: Medicaid Other | Attending: Family Medicine | Admitting: Family Medicine

## 2020-09-18 ENCOUNTER — Other Ambulatory Visit: Payer: Self-pay

## 2020-09-18 DIAGNOSIS — Z90710 Acquired absence of both cervix and uterus: Secondary | ICD-10-CM | POA: Insufficient documentation

## 2020-09-18 DIAGNOSIS — Z9889 Other specified postprocedural states: Secondary | ICD-10-CM | POA: Diagnosis not present

## 2020-09-18 NOTE — Discharge Instructions (Signed)
Abdominal Hysterectomy, Care After This sheet gives you information about how to care for yourself after your procedure. Your doctor may also give you more specific instructions. If you have problems or questions, contact your doctor. Follow these instructions at home: Bathing  Do not take baths, swim, or use a hot tub until your doctor says it is okay. Ask your doctor if you can take showers. You may only be allowed to take sponge baths for bathing.  Keep the bandage (dressing) dry until your doctor says it can be taken off. Surgical cut (incision) care      Follow instructions from your doctor about how to take care of your cut from surgery. Make sure you: ? Wash your hands with soap and water before you change your bandage (dressing). If you cannot use soap and water, use hand sanitizer. ? Change your bandage as told by your doctor. ? Leave stitches (sutures), skin glue, or skin tape (adhesive) strips in place. They may need to stay in place for 2 weeks or longer. If tape strips get loose and curl up, you may trim the loose edges. Do not remove tape strips completely unless your doctor says it is okay.  Check your surgical cut area every day for signs of infection. Check for: ? Redness, swelling, or pain. ? Fluid or blood. ? Warmth. ? Pus or a bad smell. Activity  Do gentle, daily exercise as told by your doctor. You may be told to take short walks every day and go farther each time.  Do not lift anything that is heavier than 10 lb (4.5 kg), or the limit that your doctor tells you, until he or she says that it is safe.  Do not drive or use heavy machinery while taking prescription pain medicine.  Do not drive for 24 hours if you were given a medicine to help you relax (sedative).  Follow your doctor's advice about exercise, driving, and general activities. Ask your doctor what activities are safe for you. Lifestyle   Do not douche, use tampons, or have sex for at least 6 weeks  or as told by your doctor.  Do not drink alcohol until your doctor says it is okay.  Drink enough fluid to keep your pee (urine) clear or pale yellow.  Try to have someone at home with you for the first 1-2 weeks to help.  Do not use any products that contain nicotine or tobacco, such as cigarettes and e-cigarettes. These can slow down healing. If you need help quitting, ask your doctor. General instructions  Take over-the-counter and prescription medicines only as told by your doctor.  Do not take aspirin or ibuprofen. These medicines can cause bleeding.  To prevent or treat constipation while you are taking prescription pain medicine, your doctor may suggest that you: ? Drink enough fluid to keep your urine clear or pale yellow. ? Take over-the-counter or prescription medicines. ? Eat foods that are high in fiber, such as:  Fresh fruits and vegetables.  Whole grains.  Beans. ? Limit foods that are high in fat and processed sugars, such as fried and sweet foods.  Keep all follow-up visits as told by your doctor. This is important. Contact a doctor if:  You have chills or fever.  You have redness, swelling, or pain around your cut.  You have fluid or blood coming from your cut.  Your cut feels warm to the touch.  You have pus or a bad smell coming from your cut.    Your cut breaks open.  You feel dizzy or light-headed.  You have pain or bleeding when you pee.  You keep having watery poop (diarrhea).  You keep feeling sick to your stomach (nauseous) or keep throwing up (vomiting).  You have unusual fluid (discharge) coming from your vagina.  You have a rash.  You have a reaction to your medicine.  Your pain medicine does not help. Get help right away if:  You have a fever and your symptoms get worse all of a sudden.  You have very bad belly (abdominal) pain.  You are short of breath.  You pass out (faint).  You have pain, swelling, or redness of your  leg.  You bleed a lot from your vagina and notice clumps of blood (clots). Summary  Do not take baths, swim, or use a hot tub until your doctor says it is okay. Ask your doctor if you can take showers. You may only be allowed to take sponge baths for bathing.  Follow your doctor's advice about exercise, driving, and general activities. Ask your doctor what activities are safe for you.  Do not lift anything that is heavier than 10 lb (4.5 kg), or the limit that your doctor tells you, until he or she says that it is safe.  Try to have someone at home with you for the first 1-2 weeks to help. This information is not intended to replace advice given to you by your health care provider. Make sure you discuss any questions you have with your health care provider. Document Revised: 11/11/2018 Document Reviewed: 08/27/2016 Elsevier Patient Education  2020 [employer]  

## 2020-09-18 NOTE — MAU Provider Note (Signed)
Event Date/Time  First Provider Initiated Contact with Patient 09/18/20 1358     S Ms. Tracy [name] is a 39 y.o. G2P2001 patient who presents to MAU today with multiple complaints including leaking of fluid "but not from my vagina", hot flashes, and inconsistent levels of relief with her pain medication. She endorses "burning" sensation with movement, generalized to her lower abdomen. She is s/p total abdominal hysterectomy, bilateral salpingectomy and cystoscopy with Dr. Jolayne Panther on 09/04/2020. She denies heavy bleeding, s/s of infection, poor wound healing, weakness, syncope, fever. She denies dysuria or constipation. Patient presented to [facility] evaluation yesterday but left due to waiting 3-4 hours for evaluation.  Patient is using a wheelchair to move through MAU. She denies problems ambulating but verbalizes to CNM "it's just easier". She is very tearful, reports she was warned about the possibility of heavy bleeding but is unsure if her other symptoms are typical for her surgery.  O LMP 08/31/2020    Physical Exam Vitals and nursing note reviewed. Exam conducted with a chaperone present.  Constitutional:      Appearance: Normal appearance. She is not ill-appearing.  Cardiovascular:     Rate and Rhythm: Normal rate.     Pulses: Normal pulses.  Pulmonary:     Effort: Pulmonary effort is normal.  Skin:    Capillary Refill: Capillary refill takes less than 2 seconds.     Comments: Lower abdominal incision is well approximated, no bruising, discharge or streaking  Neurological:     Mental Status: She is alert and oriented to person, place, and time.  Psychiatric:        Mood and Affect: Mood normal.        Behavior: Behavior normal.        Thought Content: Thought content normal.        Judgment: Judgment normal.     Comments: Patient is tearful but communicative and appropriate with CNM     A Medical screening exam complete Gyn post-op patient No acute findings on physical exam  in MAU Normal CBC collected yesterday 09/17/2020 in [facility]  P Discharge from MAU in stable condition Patient given the option of transfer to [facility] for further evaluation or seek care in outpatient facility of choice  List of options for follow-up given Warning signs for worsening condition that would warrant emergency follow-up discussed Patient may return to MAU as needed   F/U: Message sent to office to reschedule post-op appointment for this week if possible  Clayton Bibles, CNM 09/18/2020 9:20 PM

## 2020-09-19 ENCOUNTER — Other Ambulatory Visit: Payer: Self-pay

## 2020-09-19 ENCOUNTER — Encounter: Payer: Self-pay | Admitting: Obstetrics & Gynecology

## 2020-09-19 ENCOUNTER — Encounter (HOSPITAL_COMMUNITY): Payer: Self-pay | Admitting: Emergency Medicine

## 2020-09-19 ENCOUNTER — Other Ambulatory Visit (HOSPITAL_COMMUNITY)
Admission: RE | Admit: 2020-09-19 | Discharge: 2020-09-19 | Disposition: A | Discharge status: Home / Self Care | Payer: Medicaid Other | Source: Ambulatory Visit | Attending: Obstetrics & Gynecology | Admitting: Obstetrics & Gynecology

## 2020-09-19 ENCOUNTER — Inpatient Hospital Stay (HOSPITAL_COMMUNITY)
Admission: EM | Admit: 2020-09-19 | Discharge: 2020-09-26 | DRG: 660 | Disposition: A | Discharge status: Home / Self Care | Payer: Medicaid Other | Attending: Family Medicine | Admitting: Family Medicine

## 2020-09-19 ENCOUNTER — Ambulatory Visit (INDEPENDENT_AMBULATORY_CARE_PROVIDER_SITE_OTHER): Payer: Medicaid Other | Admitting: Obstetrics & Gynecology

## 2020-09-19 VITALS — BP 142/95 | HR 105 | Temp 99.2°F | Wt 191.0 lb

## 2020-09-19 DIAGNOSIS — Z20822 Contact with and (suspected) exposure to covid-19: Secondary | ICD-10-CM | POA: Diagnosis present

## 2020-09-19 DIAGNOSIS — Z9889 Other specified postprocedural states: Secondary | ICD-10-CM

## 2020-09-19 DIAGNOSIS — G8918 Other acute postprocedural pain: Secondary | ICD-10-CM | POA: Diagnosis not present

## 2020-09-19 DIAGNOSIS — N3000 Acute cystitis without hematuria: Secondary | ICD-10-CM

## 2020-09-19 DIAGNOSIS — Z419 Encounter for procedure for purposes other than remedying health state, unspecified: Secondary | ICD-10-CM

## 2020-09-19 DIAGNOSIS — I10 Essential (primary) hypertension: Secondary | ICD-10-CM | POA: Diagnosis present

## 2020-09-19 DIAGNOSIS — Z833 Family history of diabetes mellitus: Secondary | ICD-10-CM

## 2020-09-19 DIAGNOSIS — Z87891 Personal history of nicotine dependence: Secondary | ICD-10-CM

## 2020-09-19 DIAGNOSIS — Z90710 Acquired absence of both cervix and uterus: Secondary | ICD-10-CM

## 2020-09-19 DIAGNOSIS — B9689 Other specified bacterial agents as the cause of diseases classified elsewhere: Secondary | ICD-10-CM

## 2020-09-19 DIAGNOSIS — N136 Pyonephrosis: Secondary | ICD-10-CM | POA: Diagnosis present

## 2020-09-19 DIAGNOSIS — Z79899 Other long term (current) drug therapy: Secondary | ICD-10-CM

## 2020-09-19 DIAGNOSIS — R102 Pelvic and perineal pain: Secondary | ICD-10-CM

## 2020-09-19 DIAGNOSIS — N9989 Other postprocedural complications and disorders of genitourinary system: Principal | ICD-10-CM | POA: Diagnosis present

## 2020-09-19 DIAGNOSIS — R109 Unspecified abdominal pain: Secondary | ICD-10-CM | POA: Diagnosis present

## 2020-09-19 DIAGNOSIS — N760 Acute vaginitis: Secondary | ICD-10-CM

## 2020-09-19 DIAGNOSIS — Z8249 Family history of ischemic heart disease and other diseases of the circulatory system: Secondary | ICD-10-CM

## 2020-09-19 DIAGNOSIS — N898 Other specified noninflammatory disorders of vagina: Secondary | ICD-10-CM

## 2020-09-19 LAB — I-STAT BETA HCG BLOOD, ED (MC, WL, AP ONLY): I-stat hCG, quantitative: <5 m[IU]/mL (ref <5)

## 2020-09-19 LAB — POCT URINALYSIS DIPSTICK
Glucose, UA: NEGATIVE
Nitrite, UA: NEGATIVE
Odor: NEGATIVE
Protein, UA: POSITIVE — AB
Spec Grav, UA: 1.025 (ref 1.010–1.025)
Urobilinogen, UA: 4 E.U./dL — AB
pH, UA: 6 (ref 5.0–8.0)

## 2020-09-19 LAB — URINALYSIS, ROUTINE W REFLEX MICROSCOPIC
Bilirubin Urine: NEGATIVE
Glucose, UA: NEGATIVE mg/dL
Ketones, ur: 80 mg/dL — AB
Nitrite: NEGATIVE
Protein, ur: 100 mg/dL — AB
RBC / HPF: >50 RBC/hpf — ABNORMAL HIGH (ref 0–5)
Specific Gravity, Urine: 1.03 (ref 1.005–1.030)
WBC, UA: >50 WBC/hpf — ABNORMAL HIGH (ref 0–5)
pH: 5 (ref 5.0–8.0)

## 2020-09-19 LAB — COMPREHENSIVE METABOLIC PANEL
ALT: 17 U/L (ref 0–44)
AST: 16 U/L (ref 15–41)
Albumin: 3.4 g/dL — ABNORMAL LOW (ref 3.5–5.0)
Alkaline Phosphatase: 69 U/L (ref 38–126)
Anion gap: 12 (ref 5–15)
BUN: 10 mg/dL (ref 6–20)
CO2: 22 mmol/L (ref 22–32)
Calcium: 9.5 mg/dL (ref 8.9–10.3)
Chloride: 103 mmol/L (ref 98–111)
Creatinine, Ser: 1.22 mg/dL — ABNORMAL HIGH (ref 0.44–1.00)
GFR, Estimated: 58 mL/min — ABNORMAL LOW (ref >60)
Glucose, Bld: 112 mg/dL — ABNORMAL HIGH (ref 70–99)
Potassium: 3.8 mmol/L (ref 3.5–5.1)
Sodium: 137 mmol/L (ref 135–145)
Total Bilirubin: 0.4 mg/dL (ref 0.3–1.2)
Total Protein: 7.2 g/dL (ref 6.5–8.1)

## 2020-09-19 LAB — CBC
HCT: 34.1 % — ABNORMAL LOW (ref 36.0–46.0)
Hemoglobin: 10.4 g/dL — ABNORMAL LOW (ref 12.0–15.0)
MCH: 25.2 pg — ABNORMAL LOW (ref 26.0–34.0)
MCHC: 30.5 g/dL (ref 30.0–36.0)
MCV: 82.8 fL (ref 80.0–100.0)
Platelets: 500 10*3/uL — ABNORMAL HIGH (ref 150–400)
RBC: 4.12 MIL/uL (ref 3.87–5.11)
RDW: 18.6 % — ABNORMAL HIGH (ref 11.5–15.5)
WBC: 10.7 10*3/uL — ABNORMAL HIGH (ref 4.0–10.5)
nRBC: 0 % (ref 0.0–0.2)

## 2020-09-19 LAB — LIPASE, BLOOD: Lipase: 24 U/L (ref 11–51)

## 2020-09-19 MED ORDER — AMOXICILLIN-POT CLAVULANATE 875-125 MG PO TABS: 1.0000 | ORAL_TABLET | Freq: Two times a day (BID) | ORAL | 0 refills | Status: DC

## 2020-09-19 MED ORDER — OXYCODONE-ACETAMINOPHEN 5-325 MG PO TABS: 1.0000 | ORAL_TABLET | ORAL | 0 refills | Status: DC | PRN

## 2020-09-19 MED ORDER — METRONIDAZOLE 500 MG PO TABS: 500.0000 mg | ORAL_TABLET | Freq: Two times a day (BID) | ORAL | 0 refills | Status: DC

## 2020-09-19 NOTE — Patient Instructions (Signed)
Abdominal Hysterectomy, Care After This sheet gives you information about how to care for yourself after your procedure. Your health care provider may also give you more specific instructions. If you have problems or questions, contact your health care provider. What can I expect after the procedure? After your procedure, it is common to have:  Pain.  Fatigue.  Poor appetite.  Less interest in sex.  Vaginal bleeding and discharge. You may need to use a sanitary napkin after this procedure. Follow these instructions at home: Bathing  Do not take baths, swim, or use a hot tub until your health care provider approves. Ask your health care provider if you can take showers. You may only be allowed to take sponge baths for bathing.  Keep the bandage (dressing) dry until your health care provider says it can be removed. Incision care   Follow instructions from your health care provider about how to take care of your incision. Make sure you: ? Wash your hands with soap and water before you change your bandage (dressing). If soap and water are not available, use hand sanitizer. ? Change your dressing as told by your health care provider. ? Leave stitches (sutures), skin glue, or adhesive strips in place. These skin closures may need to stay in place for 2 weeks or longer. If adhesive strip edges start to loosen and curl up, you may trim the loose edges. Do not remove adhesive strips completely unless your health care provider tells you to do that.  Check your incision area every day for signs of infection. Check for: ? Redness, swelling, or pain. ? Fluid or blood. ? Warmth. ? Pus or a bad smell. Activity  Do gentle, daily exercises as told by your health care provider. You may be told to take short walks every day and go farther each time.  Do not lift anything that is heavier than 10 lb (4.5 kg), or the limit that your health care provider tells you, until he or she says that it is  safe.  Do not drive or use heavy machinery while taking prescription pain medicine.  Do not drive for 24 hours if you were given a medicine to help you relax (sedative).  Follow your health care provider's instructions about exercise, driving, and general activities. Ask your health care provider what activities are safe for you. Lifestyle  Do not douche, use tampons, or have sex for at least 6 weeks or as told by your health care provider.  Do not drink alcohol until your health care provider approves.  Drink enough fluid to keep your urine clear or pale yellow.  Try to have someone at home with you for the first 1-2 weeks to help.  Do not use any products that contain nicotine or tobacco, such as cigarettes and e-cigarettes. These can delay healing. If you need help quitting, ask your health care provider. General instructions  Take over-the-counter and prescription medicines only as told by your health care provider.  Do not take aspirin or ibuprofen. These medicines can cause bleeding.  To prevent or treat constipation while you are taking prescription pain medicine, your health care provider may recommend that you: ? Drink enough fluid to keep your urine clear or pale yellow. ? Take over-the-counter or prescription medicines. ? Eat foods that are high in fiber, such as fresh fruits and vegetables, whole grains, and beans. ? Limit foods that are high in fat and processed sugars, such as fried and sweet foods.  Keep all   follow-up visits as told by your health care provider. This is important. Contact a health care provider if:  You have chills or fever.  You have redness, swelling, or pain around your incision.  You have fluid or blood coming from your incision.  Your incision feels warm to the touch.  You have pus or a bad smell coming from your incision.  Your incision breaks open.  You feel dizzy or light-headed.  You have pain or bleeding when you urinate.  You  have persistent diarrhea.  You have persistent nausea and vomiting.  You have abnormal vaginal discharge.  You have a rash.  You have any type of abnormal reaction or you develop an allergy to your medicine.  Your pain medicine does not help. Get help right away if:  You have a fever and your symptoms suddenly get worse.  You have severe abdominal pain.  You have shortness of breath.  You faint.  You have pain, swelling, or redness in your leg.  You have heavy vaginal bleeding with blood clots. Summary  After your procedure, it is common to have pain, fatigue and vaginal discharge.  Do not take baths, swim, or use a hot tub until your health care provider approves. Ask your health care provider if you can take showers. You may only be allowed to take sponge baths for bathing.  Follow your health care provider's instructions about exercise, driving, and general activities. Ask your health care provider what activities are safe for you.  Do not lift anything that is heavier than 10 lb (4.5 kg), or the limit that your health care provider tells you, until he or she says that it is safe.  Try to have someone at home with you for the first 1-2 weeks to help. This information is not intended to replace advice given to you by your health care provider. Make sure you discuss any questions you have with your health care provider. Document Revised: 10/12/2018 Document Reviewed: 08/27/2016 Elsevier Patient Education  2020 [employer]  

## 2020-09-19 NOTE — ED Triage Notes (Signed)
Patient arrived with EMS from home reports pain across her abdomen today with emesis and diarrhea , diagnosed with UTI today prescribed with oral antibiotics . CBG= 128

## 2020-09-19 NOTE — Progress Notes (Signed)
History:  39 y.o.LMP here today for 2 week post op check.Pt is s/p TAH with bilateral salpingectomy on 09/04/2020.  Pt presents for a problem visit. She reports pain that is not controlled with Percocet. She reports copious pinkish clear vaginal discharge and dysuria. She does not know if she's had a temperature elevation but,has had some hot flushes since surgery.  Her other sx began 4 days prev. She reports that this is a distinct change from her initial pre-op course. She is eating and passing stools without difficulty.  Her appetite is decreased because she doesn't feel well but, there is no emesis.     The following portions of the patient's history were reviewed and updated as appropriate: allergies, current medications, past family history, past medical history, past social history, past surgical history and problem list.  Review of Systems:  Pertinent items are noted in HPI.    Objective:  Physical Exam BP (!) 142/95   Pulse (!) 105   Wt 191 lb (86.6 kg)   LMP 08/31/2020   BMI 28.21 kg/m temp 99.2  CONSTITUTIONAL: Well-developed, well-nourished female who appear uncomfortable on exam but is not ill in appearance and is in no acute distress.   HENT:  Normocephalic, atraumatic EYES: Conjunctivae and EOM are normal. No scleral icterus.  NECK: Normal range of motion SKIN: Skin is warm and dry. No rash noted. Not diaphoretic.No pallor. [place]: Alert and oriented to person, place, and time. Normal coordination.  Abd: Soft, diffusely, tender but nondistended; her incision is C/D/I  Pelvic:  GU: EGBUS: no lesions Vagina: no blood in vault; the cuff is intact. There is copious amounts of a clear grayish discharge. No odor. No mucopurulent d/c Adnexa: no masses; non tender   UA:  Large blood; +leuk; neg nitrates.   Labs and Imaging Surg path 09/04/2020 UTERUS AND BILATERAL FALLOPIAN TUBES, HYSTERECTOMY AND BILATERAL  SALPINGECTOMY:  Uterus:  - Proliferative endometrium  -  Leiomyoma (7.3 cm; largest)  - Adenomyosis  - No hyperplasia or malignancy identified   Cervix:  - Benign cervix with nabothian cyst  - No dysplasia or malignancy identified   Fallopian tubes, bilateral:  - Benign fallopian tubes  - No malignancy identified    Assessment & Plan:  2 week post op check following TAH with bilateral salpingectomy. Problem visit. UTI and BV. Need to r/o other potential issues.    Reviewed her surg path.   Reviewed post op instructions and activities  Gradual increase in activities  F/u in 4 days with Dr. Elly Modena or sooner prn. Pt give s/sx to f/u sooner  Reviewed no ETOH with Flagyl  All questions answered.   Natachia was seen today for follow-up.  Diagnoses and all orders for this visit:  Post-operative state -     oxyCODONE-acetaminophen (PERCOCET/ROXICET) 5-325 MG tablet; Take 1 tablet by mouth every 4 (four) hours as needed.  BV (bacterial vaginosis) -     Aerobic culture -     CBC with Differential -     metroNIDAZOLE (FLAGYL) 500 MG tablet; Take 1 tablet (500 mg total) by mouth 2 (two) times daily.  Acute cystitis without hematuria -     CBC with Differential -     amoxicillin-clavulanate (AUGMENTIN) 875-125 MG tablet; Take 1 tablet by mouth 2 (two) times daily.  Vaginal discharge -     Cervicovaginal ancillary only( [place]) -     Aerobic culture -     metroNIDAZOLE (FLAGYL) 500 MG tablet; Take 1 tablet (  500 mg total) by mouth 2 (two) times daily.  Acute post-operative pain -     Cervicovaginal ancillary only( [place]) -     Aerobic culture -     POCT Urinalysis Dipstick -     CBC with Differential -     metroNIDAZOLE (FLAGYL) 500 MG tablet; Take 1 tablet (500 mg total) by mouth 2 (two) times daily. -     amoxicillin-clavulanate (AUGMENTIN) 875-125 MG tablet; Take 1 tablet by mouth 2 (two) times daily.  Smriti Barkow L. Harraway-Smith, M.D., [place] [place]

## 2020-09-19 NOTE — Progress Notes (Signed)
Pt in tears c/o leaking "pinkish" fluid from her vagina, hot flashes, abdominal pain, and burning pain L side abd. No relief with Percocet.  She is s/p Abdominal Hysterectomy, BTL, and Cytoscopy on 09/04/20.  Pt did not take BP meds today.

## 2020-09-20 ENCOUNTER — Emergency Department (HOSPITAL_COMMUNITY): Payer: Medicaid Other

## 2020-09-20 DIAGNOSIS — G8918 Other acute postprocedural pain: Secondary | ICD-10-CM | POA: Diagnosis present

## 2020-09-20 LAB — CBC WITH DIFFERENTIAL/PLATELET
Basophils Absolute: 0 10*3/uL (ref 0.0–0.2)
Basos: 1 %
EOS (ABSOLUTE): 0.3 10*3/uL (ref 0.0–0.4)
Eos: 4 %
Hematocrit: 33.9 % — ABNORMAL LOW (ref 34.0–46.6)
Hemoglobin: 10.4 g/dL — ABNORMAL LOW (ref 11.1–15.9)
Immature Grans (Abs): 0 10*3/uL (ref 0.0–0.1)
Immature Granulocytes: 0 %
Lymphocytes Absolute: 1.1 10*3/uL (ref 0.7–3.1)
Lymphs: 15 %
MCH: 24.9 pg — ABNORMAL LOW (ref 26.6–33.0)
MCHC: 30.7 g/dL — ABNORMAL LOW (ref 31.5–35.7)
MCV: 81 fL (ref 79–97)
Monocytes Absolute: 0.5 10*3/uL (ref 0.1–0.9)
Monocytes: 7 %
Neutrophils Absolute: 5.4 10*3/uL (ref 1.4–7.0)
Neutrophils: 73 %
Platelets: 522 10*3/uL — ABNORMAL HIGH (ref 150–450)
RBC: 4.18 x10E6/uL (ref 3.77–5.28)
RDW: 18.7 % — ABNORMAL HIGH (ref 11.7–15.4)
WBC: 7.5 10*3/uL (ref 3.4–10.8)

## 2020-09-20 LAB — CERVICOVAGINAL ANCILLARY ONLY
Bacterial Vaginitis (gardnerella): POSITIVE — AB
Candida Glabrata: NEGATIVE
Candida Vaginitis: NEGATIVE
Chlamydia: NEGATIVE
Comment: NEGATIVE
Comment: NEGATIVE
Comment: NEGATIVE
Comment: NEGATIVE
Comment: NEGATIVE
Comment: NORMAL
Neisseria Gonorrhea: NEGATIVE
Trichomonas: NEGATIVE

## 2020-09-20 LAB — PROTIME-INR
INR: 1.1 (ref 0.8–1.2)
Prothrombin Time: 13.5 seconds (ref 11.4–15.2)

## 2020-09-20 LAB — RESP PANEL BY RT-PCR (FLU A&B, COVID) ARPGX2
Influenza A by PCR: NEGATIVE
Influenza B by PCR: NEGATIVE
SARS Coronavirus 2 by RT PCR: NEGATIVE

## 2020-09-20 LAB — LACTIC ACID, PLASMA: Lactic Acid, Venous: 0.6 mmol/L (ref 0.5–1.9)

## 2020-09-20 LAB — APTT: aPTT: 31 seconds (ref 24–36)

## 2020-09-20 MED ORDER — ONDANSETRON HCL 4 MG PO TABS: 4.0000 mg | ORAL_TABLET | Freq: Four times a day (QID) | ORAL | Status: DC | PRN

## 2020-09-20 MED ORDER — DOCUSATE SODIUM 100 MG PO CAPS
100.0000 mg | ORAL_CAPSULE | Freq: Two times a day (BID) | ORAL | Status: DC
  Administered 2020-09-20: 100 mg via ORAL
  Filled 2020-09-20 (×2): qty 1

## 2020-09-20 MED ORDER — SODIUM CHLORIDE 0.9 % IV SOLN
1.5000 g | Freq: Four times a day (QID) | INTRAVENOUS | Status: DC
  Administered 2020-09-20 – 2020-09-21 (×3): 1.5 g via INTRAVENOUS
  Filled 2020-09-20 (×2): qty 4
  Filled 2020-09-20: qty 1.5
  Filled 2020-09-20 (×4): qty 4

## 2020-09-20 MED ORDER — ACETAMINOPHEN 325 MG PO TABS: 650.0000 mg | ORAL_TABLET | ORAL | Status: DC | PRN

## 2020-09-20 MED ORDER — ALUM & MAG HYDROXIDE-SIMETH 200-200-20 MG/5ML PO SUSP: 30.0000 mL | ORAL | Status: DC | PRN

## 2020-09-20 MED ORDER — OXYCODONE HCL 5 MG PO TABS
5.0000 mg | ORAL_TABLET | Freq: Once | ORAL | Status: AC
  Administered 2020-09-20: 18:00:00 5 mg via ORAL
  Filled 2020-09-20: qty 1

## 2020-09-20 MED ORDER — BISACODYL 5 MG PO TBEC: 5.0000 mg | DELAYED_RELEASE_TABLET | Freq: Every day | ORAL | Status: DC | PRN

## 2020-09-20 MED ORDER — SODIUM CHLORIDE 0.9 % IV SOLN
1.0000 g | Freq: Once | INTRAVENOUS | Status: AC
  Administered 2020-09-20: 18:00:00 1 g via INTRAVENOUS
  Filled 2020-09-20: qty 10

## 2020-09-20 MED ORDER — SIMETHICONE 80 MG PO CHEW
80.0000 mg | CHEWABLE_TABLET | Freq: Four times a day (QID) | ORAL | Status: DC | PRN
  Filled 2020-09-20: qty 1

## 2020-09-20 MED ORDER — MORPHINE SULFATE (PF) 2 MG/ML IV SOLN
1.0000 mg | INTRAVENOUS | Status: DC | PRN
  Administered 2020-09-21: 2 mg via INTRAVENOUS
  Administered 2020-09-21: 1 mg via INTRAVENOUS
  Filled 2020-09-20 (×2): qty 1

## 2020-09-20 MED ORDER — OXYCODONE-ACETAMINOPHEN 5-325 MG PO TABS: 1.0000 | ORAL_TABLET | ORAL | Status: DC | PRN

## 2020-09-20 MED ORDER — KETOROLAC TROMETHAMINE 30 MG/ML IJ SOLN
15.0000 mg | Freq: Four times a day (QID) | INTRAMUSCULAR | Status: DC
  Administered 2020-09-20 – 2020-09-21 (×2): 15 mg via INTRAVENOUS
  Filled 2020-09-20 (×2): qty 1

## 2020-09-20 MED ORDER — SENNOSIDES-DOCUSATE SODIUM 8.6-50 MG PO TABS: 1.0000 | ORAL_TABLET | Freq: Every evening | ORAL | Status: DC | PRN

## 2020-09-20 MED ORDER — LACTATED RINGERS IV BOLUS
1000.0000 mL | Freq: Once | INTRAVENOUS | Status: AC
  Administered 2020-09-20: 16:00:00 1000 mL via INTRAVENOUS

## 2020-09-20 MED ORDER — IOHEXOL 300 MG/ML  SOLN
100.0000 mL | Freq: Once | INTRAMUSCULAR | Status: AC | PRN
  Administered 2020-09-20: 100 mL via INTRAVENOUS

## 2020-09-20 MED ORDER — MAGNESIUM CITRATE PO SOLN: 1.0000 | Freq: Once | ORAL | Status: DC | PRN

## 2020-09-20 MED ORDER — DEXTROSE IN LACTATED RINGERS 5 % IV SOLN: INTRAVENOUS | Status: DC

## 2020-09-20 MED ORDER — ONDANSETRON HCL 4 MG/2ML IJ SOLN
4.0000 mg | Freq: Four times a day (QID) | INTRAMUSCULAR | Status: DC | PRN
  Administered 2020-09-21: 4 mg via INTRAVENOUS
  Filled 2020-09-20 (×2): qty 2

## 2020-09-20 MED ORDER — ZOLPIDEM TARTRATE 5 MG PO TABS: 5.0000 mg | ORAL_TABLET | Freq: Every evening | ORAL | Status: DC | PRN

## 2020-09-20 NOTE — H&P (Signed)
Tracy [name] is an 39 y.o. female. G2P1 with a history of uterine fibroids status post recent hysterectomy (total hyst with bilateral salpingectomy, diagnostic cystoscopy) on 09/04/2020.  She presents to ED with complaints of abdominal pain, nausea/vomiting.  She also complains of fluid leaking from vagina. A few days after her procedure, she started to have dysuria.  Approximately 1 week ago, she started to develop worsening abdominal pain.  Initially the pain felt like tightening paraumbilical and progressed to intermittent severe episodes of pain.  Pt presented to the ED a few days ago, but left due to long wait times.  On Christmas day she started to have "gushing of fluid," which she has been able to identify as urine. Not sure if this is vaginal or urethral output, but does have urine odor.  She is soaking through pads and does not feel able to control this flow of fluid.  She saw OBGYN yesterday and was diagnosed with UTI And BV; was started on augmentin and flagyl.  Has been taking percocet (prescribed by GYN yesterday) and ibuprofen for the pain, which helps some.     Pertinent Gynecological History: Menses: s/p hyserectomy prev heavy irregular cycles  OB History: G2, P1   Menstrual History: Menarche age: 33 Patient's last menstrual period was 08/31/2020.    Past Medical History:  Diagnosis Date  . Anemia   . Hypertension     Past Surgical History:  Procedure Laterality Date  . ANKLE SURGERY  2005  . CYSTOSCOPY N/A 09/04/2020   Procedure: CYSTOSCOPY;  Surgeon: Catalina Antigua, MD;  Location: [facility] OR;  Service: Gynecology;  Laterality: N/A;  . HYSTERECTOMY ABDOMINAL WITH SALPINGECTOMY N/A 09/04/2020   Procedure: HYSTERECTOMY ABDOMINAL WITH SALPINGECTOMY;  Surgeon: Catalina Antigua, MD;  Location: [facility] OR;  Service: Gynecology;  Laterality: N/A;  . KNEE SURGERY  2005   both   . OVARIAN CYST REMOVAL  2016  . OVARIAN CYST REMOVAL  2018    Family History  Problem Relation Age of  Onset  . Diabetes Maternal Grandfather   . Hypertension Maternal Grandfather     Social History:  reports that she has quit smoking. She has never used smokeless tobacco. She reports current alcohol use. She reports current drug use. Drug: Marijuana.  Allergies: No Known Allergies  (Not in a hospital admission)   Review of Systems  Constitutional: Positive for chills and fever. Negative for activity change, appetite change, fatigue and unexpected weight change.  HENT: Negative.   Eyes: Negative.   Respiratory: Negative.  Negative for chest tightness, shortness of breath and wheezing.   Cardiovascular: Negative.  Negative for chest pain and leg swelling.  Gastrointestinal: Positive for abdominal distention and nausea. Negative for abdominal pain, constipation, diarrhea and vomiting.  Endocrine: Negative.   Genitourinary: Positive for difficulty urinating, dysuria, flank pain, pelvic pain and vaginal discharge. Negative for hematuria.  Neurological: Negative.  Negative for dizziness, weakness, light-headedness, numbness and headaches.  Hematological: Negative.   Psychiatric/Behavioral: Negative.  Negative for agitation, confusion and decreased concentration.    Blood pressure (!) 141/99, pulse (!) 101, temperature 98.2 F (36.8 C), temperature source Oral, resp. rate 17, height 5\' 9"  (1.753 m), weight 86.2 kg, last menstrual period 08/31/2020, SpO2 99 %. Physical Exam Vitals reviewed.  Constitutional:      Appearance: She is well-developed and well-nourished.  HENT:     Head: Normocephalic and atraumatic.  Eyes:     Pupils: Pupils are equal, round, and reactive to light.  Cardiovascular:  Rate and Rhythm: Normal rate.  Pulmonary:     Effort: Pulmonary effort is normal.  Abdominal:     General: There is no distension.     Palpations: Abdomen is soft.     Tenderness: There is generalized abdominal tenderness and tenderness in the suprapubic area and left lower quadrant.  There is left CVA tenderness. There is no guarding or rebound.  Musculoskeletal:        General: No edema.     Cervical back: Normal range of motion.  Skin:    General: Skin is warm and dry.  Neurological:     Mental Status: She is alert and oriented to person, place, and time.  Psychiatric:        Mood and Affect: Mood and affect normal.        Behavior: Behavior normal.     Results for orders placed or performed during the hospital encounter of 09/19/20 (from the past 24 hour(s))  Protime-INR     Status: None   Collection Time: 09/20/20  3:46 PM  Result Value Ref Range   Prothrombin Time 13.5 11.4 - 15.2 seconds   INR 1.1 0.8 - 1.2  APTT     Status: None   Collection Time: 09/20/20  3:46 PM  Result Value Ref Range   aPTT 31 24 - 36 seconds  Lactic acid, plasma     Status: None   Collection Time: 09/20/20  4:20 PM  Result Value Ref Range   Lactic Acid, Venous 0.6 0.5 - 1.9 mmol/L  Resp Panel by RT-PCR (Flu A&B, Covid) Nasopharyngeal Swab     Status: None   Collection Time: 09/20/20  9:30 PM   Specimen: Nasopharyngeal Swab; Nasopharyngeal(NP) swabs in vial transport medium  Result Value Ref Range   SARS Coronavirus 2 by RT PCR NEGATIVE NEGATIVE   Influenza A by PCR NEGATIVE NEGATIVE   Influenza B by PCR NEGATIVE NEGATIVE    CT ABDOMEN PELVIS W CONTRAST  Result Date: 09/20/2020 CLINICAL DATA:  Abdomen pain and possible urine leakage following hysterectomy pink fluid from vagina EXAM: CT ABDOMEN AND PELVIS WITH CONTRAST TECHNIQUE: Multidetector CT imaging of the abdomen and pelvis was performed using the standard protocol following bolus administration of intravenous contrast. CONTRAST:  OMNIPAQUE IOHEXOL 300 MG/ML  SOLN COMPARISON:  None. FINDINGS: Lower chest: Lung bases demonstrate no acute consolidation or effusion. Hepatobiliary: No focal liver abnormality is seen. No gallstones, gallbladder wall thickening, or biliary dilatation. Pancreas: Unremarkable. No pancreatic  ductal dilatation or surrounding inflammatory changes. Spleen: Normal in size without focal abnormality. Adrenals/Urinary Tract: Adrenal glands are normal. Moderate left and mild to moderate right hydronephrosis. There is left hydroureter. Ureter can be followed to the level of the mid pelvis and then becomes obscured by fluid collection within the pelvis, series 4, image number 72. Urinary bladder is unremarkable. The right ureter cannot be completely follow distally as well. There is delayed excretion of contrast within the left kidney consistent with decreased renal function. Stomach/Bowel: The stomach is nonenlarged no dilated small bowel. Mild thickened small bowel loops within the pelvis. Vascular/Lymphatic: Nonaneurysmal aorta.  No suspicious nodes Reproductive: Status post hysterectomy.  No adnexal mass. Other: No free air. Small amount of free fluid adjacent to the liver and spleen as well as within the bilateral lower quadrants and pelvis. Mildly rim enhancing collection superior to the bladder measuring approximately 8.5 x 5.2 cm, it is contiguous with irregular fluid in the posterior pelvis and the fluid in the  vicinity of the of distal left ureter. Fluid collection may be contiguous with the vaginal cough. Musculoskeletal: No acute or significant osseous findings. IMPRESSION: 1. 8.5 cm mildly rim enhancing fluid collection within the pelvis. Associated findings of left greater than right hydronephrosis with decreased left renal function on delayed imaging as well as the presence of small amount of abdominopelvic ascites raise concern for possible ureteral injury and urinoma. Other consideration for the pelvic fluid collection could include hematoma or postoperative collection, there is some suggestion that the fluid collection may be contiguous with the vaginal cuff. No internal gas within the fluid collection as could be seen with abscess. 2. Thickened small bowel loops within the pelvis are likely  reactive. Electronically Signed   By: Donavan Foil M.D.   On: 09/20/2020 18:09    Assessment/Plan: Abd/pelvic pain: 8.5cm fluid collection concerning for urinoma vs hematoma vs abscess. Imaging discussed with radiology and IR.  Plan on fluid collection and drain placement to assess for ureteral injury vs mass effect. If injury noted will consult urology for further management. Will start Unasyn for empiric abx.   Cherre Blanc 09/20/2020, 10:23 PM

## 2020-09-20 NOTE — ED Provider Notes (Signed)
I have personally seen and examined the patient. I have reviewed the documentation on PMH/FH/Soc Hx. I have discussed the plan of care with the resident and patient.  I have reviewed and agree with the resident's documentation. Please see associated encounter note.  Briefly, the patient is a 39 y.o. female here with lower abdominal pain, UTI symptoms.  Patient with unremarkable vitals.  No fever.  Diagnosed with bacterial vaginosis and UTI yesterday.  Started on Flagyl and Augmentin.  Recently had a hysterectomy about 3 weeks ago.  Having some urinary frequency.  Saw OB yesterday.  Has been constipated.  Urinalysis appears infectious.  Will give a dose of Rocephin.  We will switch her over to Keflex.  We will get a CT scan of the abdomen and pelvis to further evaluate for bowel obstruction or other complications from hysterectomy.  Otherwise lab work is unremarkable.  Have a low suspicion for sepsis but have sent blood cultures.  Fluid bolus given as well.  This chart was dictated using voice recognition software.  Despite best efforts to proofread,  errors can occur which can change the documentation meaning.     EKG Interpretation  Date/Time:  Thursday September 20 2020 16:13:45 EST Ventricular Rate:  97 PR Interval:    QRS Duration: 108 QT Interval:  342 QTC Calculation: 435 R Axis:   -23 Text Interpretation: Sinus rhythm Right atrial enlargement Incomplete left bundle branch block Confirmed by Virgina Norfolk [id number]) on 09/20/2020 4:44:58 PM         Virgina Norfolk, DO 09/20/20 1713

## 2020-09-20 NOTE — ED Provider Notes (Signed)
[facility] EMERGENCY DEPARTMENT Provider Note   CSN: [id number] Arrival date & time: 09/19/20  2022     History Chief Complaint  Patient presents with  . Abdominal Pain    UTI    Sama Ciallella is a 39 y.o. female.  HPI Patient is a 39 year old female with a history of uterine fibroids who presents to ED with complaints of abdominal pain, nausea/vomiting.  Patient is status post recent hysterectomy (total hyst with bilateral salpingectomy) on 09/04/2020.  She states that a few days after her procedure, she started to have dysuria.  Approximately 1 week ago, she started to develop worsening abdominal pain.  Initially the pain felt like a "tightening" at the level of her umbilicus, but this has progressed to where she occasionally has flashes of severe "electrical" pain and pain that feels like a pulling behind her navel. Pt presented to the ED a few days ago, but left due to long wait times. As her pain became more severe, though, she wanted to be evaluated.  Additionally, on Christmas day patient started to have "gushing of fluid," which she has been able to identify as urine. Not sure if this is vaginal or urethral output.  She is soaking through pads and does not feel able to control this flow of fluid.  She is concerned as she had a friend with similar symptoms following hysterectomy who had a ureteral injury; she is worried she may have the same. Saw OBGYN yesterday and was diagnosed with UTI And BV; was started on augmentin and flagyl. Took doses of these last night but has not had a dose yet today. Has been taking percocet (prescribed by GYN yesterday) and ibuprofen for the pain.     Past Medical History:  Diagnosis Date  . Anemia   . Hypertension     Patient Active Problem List   Diagnosis Date Noted  . S/P abdominal hysterectomy 09/04/2020    Past Surgical History:  Procedure Laterality Date  . ANKLE SURGERY  2005  . CYSTOSCOPY N/A 09/04/2020    Procedure: CYSTOSCOPY;  Surgeon: Mora Bellman, MD;  Location: [facility];  Service: Gynecology;  Laterality: N/A;  . HYSTERECTOMY ABDOMINAL WITH SALPINGECTOMY N/A 09/04/2020   Procedure: HYSTERECTOMY ABDOMINAL WITH SALPINGECTOMY;  Surgeon: Mora Bellman, MD;  Location: [facility];  Service: Gynecology;  Laterality: N/A;  . KNEE SURGERY  2005   both   . OVARIAN CYST REMOVAL  2016  . OVARIAN CYST REMOVAL  2018     OB History    Gravida  2   Para  2   Term  2   Preterm      AB      Living  1     SAB      IAB      Ectopic      Multiple      Live Births  2           Family History  Problem Relation Age of Onset  . Diabetes Maternal Grandfather   . Hypertension Maternal Grandfather     Social History   Tobacco Use  . Smoking status: Former Research scientist (life sciences)  . Smokeless tobacco: Never Used  Substance Use Topics  . Alcohol use: Yes    Comment: occassional  . Drug use: Yes    Types: Marijuana    Comment: 3 times per month when on menstrual cycle    Home Medications Prior to Admission medications   Medication Sig Start Date End  Date Taking? Authorizing Provider  amLODipine (NORVASC) 5 MG tablet Take 5 mg by mouth daily. Patient not taking: Reported on 09/19/2020 08/24/20   [provider]  amoxicillin-clavulanate (AUGMENTIN) 875-125 MG tablet Take 1 tablet by mouth 2 (two) times daily. 09/19/20   Lavonia Drafts, MD  Aspirin-Caffeine (BC FAST PAIN RELIEF PO) Take 1 packet by mouth daily as needed (pain). Patient not taking: Reported on 09/19/2020    [provider]  Carboxymethylcellul-Glycerin (LUBRICATING EYE DROPS OP) Place 1 drop into both eyes daily as needed (dry eyes). Patient not taking: Reported on 09/19/2020    [provider]  docusate sodium (COLACE) 100 MG capsule Take 1 capsule (100 mg total) by mouth 2 (two) times daily. Patient not taking: Reported on 09/19/2020 09/06/20   Constant, Peggy, MD  Ferrous Sulfate (IRON PO) Take  2 tablets by mouth daily. Patient not taking: Reported on 09/19/2020    [provider]  ibuprofen (ADVIL) 600 MG tablet Take 1 tablet (600 mg total) by mouth every 6 (six) hours as needed. 09/06/20   Constant, Peggy, MD  metroNIDAZOLE (FLAGYL) 500 MG tablet Take 1 tablet (500 mg total) by mouth 2 (two) times daily. 09/19/20   Lavonia Drafts, MD  oxyCODONE-acetaminophen (PERCOCET/ROXICET) 5-325 MG tablet Take 1 tablet by mouth every 6 (six) hours as needed for moderate pain or severe pain. Patient not taking: Reported on 09/19/2020 09/06/20   Constant, Peggy, MD  oxyCODONE-acetaminophen (PERCOCET/ROXICET) 5-325 MG tablet Take 1 tablet by mouth every 4 (four) hours as needed. 09/19/20   Lavonia Drafts, MD  triamcinolone (KENALOG) 0.1 % Apply 1 application topically 2 (two) times daily as needed for rash. Patient not taking: Reported on 09/19/2020 05/02/20   [provider]    Allergies    Patient has no known allergies.  Review of Systems   Review of Systems  Constitutional: Positive for fever (subjective). Negative for chills.  HENT: Negative for ear pain, rhinorrhea and sore throat.   Eyes: Negative for pain and visual disturbance.  Respiratory: Negative for cough, shortness of breath and wheezing.   Cardiovascular: Negative for chest pain and leg swelling.  Gastrointestinal: Positive for abdominal pain, nausea and vomiting (x1, bilious after not eating much through the day). Negative for diarrhea.  Genitourinary: Positive for dysuria.  Musculoskeletal: Positive for back pain and gait problem (Due to pain). Negative for joint swelling.  Skin: Negative for color change and rash.       Itching over incision site  Neurological: Negative for syncope, light-headedness and headaches.  Psychiatric/Behavioral: Negative for confusion and suicidal ideas.  All other systems reviewed and are negative.   Physical Exam Updated Vital Signs BP (!) 136/98 (BP  Location: Left Arm)   Pulse 96   Temp 98.2 F (36.8 C) (Oral)   Resp 20   Ht 5\' 9"  (1.753 m)   Wt 86.2 kg   LMP 08/31/2020   SpO2 98%   BMI 28.06 kg/m   Physical Exam Vitals and nursing note reviewed.  Constitutional:      General: She is not in acute distress.    Appearance: She is well-developed and well-nourished. She is not ill-appearing or toxic-appearing.  HENT:     Head: Normocephalic and atraumatic.     Mouth/Throat:     Pharynx: Oropharynx is clear.  Eyes:     General: No scleral icterus.    Conjunctiva/sclera: Conjunctivae normal.     Pupils: Pupils are equal, round, and reactive to light.  Cardiovascular:  Rate and Rhythm: Regular rhythm. Tachycardia present.     Heart sounds: Normal heart sounds. No murmur heard. No friction rub. No gallop.   Pulmonary:     Effort: Pulmonary effort is normal. No respiratory distress.     Breath sounds: No wheezing, rhonchi or rales.  Abdominal:     General: A surgical scar is present.     Palpations: Abdomen is soft.     Tenderness: There is generalized abdominal tenderness (Worse over L abd). There is left CVA tenderness (Mild) and guarding. There is no right CVA tenderness.     Comments: Low transverse incision appears to be healing well. Clean, dry, no surrounding erythema, induration, or drainage.  Musculoskeletal:        General: No edema.     Cervical back: Neck supple.  Skin:    General: Skin is warm and dry.  Neurological:     Mental Status: She is alert.     Comments: Alert, grossly oriented, moves all extremities spontaneously  Psychiatric:        Mood and Affect: Mood and affect normal.        Behavior: Behavior normal.     ED Results / Procedures / Treatments   Labs (all labs ordered are listed, but only abnormal results are displayed) Labs Reviewed  COMPREHENSIVE METABOLIC PANEL - Abnormal; Notable for the following components:      Result Value   Glucose, Bld 112 (*)    Creatinine, Ser 1.22 (*)     Albumin 3.4 (*)    GFR, Estimated 58 (*)    All other components within normal limits  CBC - Abnormal; Notable for the following components:   WBC 10.7 (*)    Hemoglobin 10.4 (*)    HCT 34.1 (*)    MCH 25.2 (*)    RDW 18.6 (*)    Platelets 500 (*)    All other components within normal limits  URINALYSIS, ROUTINE W REFLEX MICROSCOPIC - Abnormal; Notable for the following components:   Color, Urine AMBER (*)    APPearance HAZY (*)    Hgb urine dipstick MODERATE (*)    Ketones, ur 80 (*)    Protein, ur 100 (*)    Leukocytes,Ua MODERATE (*)    RBC / HPF >50 (*)    WBC, UA >50 (*)    Bacteria, UA RARE (*)    All other components within normal limits  LIPASE, BLOOD  I-STAT BETA HCG BLOOD, ED ([facility], [facility], [facility] ONLY)    EKG None  Radiology No results found.  Procedures Procedures (including critical care time)  Medications Ordered in ED Medications - No data to display  ED Course  I have reviewed the triage vital signs and the nursing notes.  Pertinent labs & imaging results that were available during my care of the patient were reviewed by me and considered in my medical decision making (see chart for details).    MDM Rules/Calculators/A&P                          39 y/o female with abdominal pain, dysuria, incontinence following recent hysterectomy. Pt hemodynamically stable but intermittently tachycardic. She has diffuse abdominal tenderness and guarding on exam. It is possible pt's symptoms are due to UTI/pyelonephritis, but in this post-operative period and with AKI, I am concerned for post-op complication e.g. ureteral injury, fistula, intra-abdominal abscess. 1g rocephin ordered for UTI coverage, LR bolus given.  Labs reviewed and  notable for AKI, leukocytosis, U/A with hematuria and concerning for infection. ECG shows sinus rhythm with a rate of 97. There is R atrial enlargement and incomplete LBBB. No STEMI or evidence acute ischemia/infarct. Imaging interpreted by  radiology and images personally reviewed.  CT abdomen pelvis shows 8.5 cm fluid collection within the pelvis with left greater than right hydronephrosis and decreased left renal function on delayed imaging as well as small amount of abdominopelvic ascites; these findings are concerning for ureteral injury and urinoma, vs postoperative collection or hematoma.  OB/GYN consulted. They have evaluated the pt and are obtaining IR consult to evaluate for possible percutaneous drain placement. Pt will be admitted to the OB/GYN service for further evaluation and monitoring. She remained in stable condition at time of admission.  Final Clinical Impression(s) / ED Diagnoses Final diagnoses:  Pelvic pain     Corliss Blacker, MD 09/20/20 2219    Virgina Norfolk, DO 09/20/20 2226

## 2020-09-21 ENCOUNTER — Encounter (HOSPITAL_COMMUNITY)
Admission: EM | Disposition: A | Discharge status: Home / Self Care | Payer: Self-pay | Source: Home / Self Care | Attending: Family Medicine

## 2020-09-21 ENCOUNTER — Observation Stay (HOSPITAL_COMMUNITY): Payer: Medicaid Other | Admitting: Anesthesiology

## 2020-09-21 ENCOUNTER — Observation Stay (HOSPITAL_COMMUNITY): Payer: Medicaid Other

## 2020-09-21 ENCOUNTER — Inpatient Hospital Stay (HOSPITAL_COMMUNITY): Payer: Medicaid Other

## 2020-09-21 ENCOUNTER — Encounter (HOSPITAL_COMMUNITY): Payer: Self-pay | Admitting: Family Medicine

## 2020-09-21 DIAGNOSIS — Z79899 Other long term (current) drug therapy: Secondary | ICD-10-CM | POA: Diagnosis not present

## 2020-09-21 DIAGNOSIS — N9989 Other postprocedural complications and disorders of genitourinary system: Secondary | ICD-10-CM | POA: Diagnosis present

## 2020-09-21 DIAGNOSIS — R1900 Intra-abdominal and pelvic swelling, mass and lump, unspecified site: Secondary | ICD-10-CM | POA: Diagnosis not present

## 2020-09-21 DIAGNOSIS — G8918 Other acute postprocedural pain: Secondary | ICD-10-CM | POA: Diagnosis present

## 2020-09-21 DIAGNOSIS — Z90710 Acquired absence of both cervix and uterus: Secondary | ICD-10-CM | POA: Diagnosis not present

## 2020-09-21 DIAGNOSIS — N368 Other specified disorders of urethra: Secondary | ICD-10-CM | POA: Diagnosis not present

## 2020-09-21 DIAGNOSIS — R109 Unspecified abdominal pain: Secondary | ICD-10-CM | POA: Diagnosis present

## 2020-09-21 DIAGNOSIS — Z20822 Contact with and (suspected) exposure to covid-19: Secondary | ICD-10-CM | POA: Diagnosis present

## 2020-09-21 DIAGNOSIS — N136 Pyonephrosis: Secondary | ICD-10-CM | POA: Diagnosis present

## 2020-09-21 DIAGNOSIS — Z87891 Personal history of nicotine dependence: Secondary | ICD-10-CM | POA: Diagnosis not present

## 2020-09-21 DIAGNOSIS — I10 Essential (primary) hypertension: Secondary | ICD-10-CM | POA: Diagnosis present

## 2020-09-21 DIAGNOSIS — Z833 Family history of diabetes mellitus: Secondary | ICD-10-CM | POA: Diagnosis not present

## 2020-09-21 DIAGNOSIS — Z9889 Other specified postprocedural states: Secondary | ICD-10-CM | POA: Diagnosis not present

## 2020-09-21 DIAGNOSIS — Z8249 Family history of ischemic heart disease and other diseases of the circulatory system: Secondary | ICD-10-CM | POA: Diagnosis not present

## 2020-09-21 HISTORY — PX: CYSTOSCOPY W/ URETERAL STENT PLACEMENT: SHX1429

## 2020-09-21 HISTORY — PX: URETERAL REIMPLANTION: SHX2611

## 2020-09-21 LAB — CBC
HCT: 31.9 % — ABNORMAL LOW (ref 36.0–46.0)
HCT: 32.4 % — ABNORMAL LOW (ref 36.0–46.0)
Hemoglobin: 9.7 g/dL — ABNORMAL LOW (ref 12.0–15.0)
Hemoglobin: 9.9 g/dL — ABNORMAL LOW (ref 12.0–15.0)
MCH: 25.2 pg — ABNORMAL LOW (ref 26.0–34.0)
MCH: 25.1 pg — ABNORMAL LOW (ref 26.0–34.0)
MCHC: 30.4 g/dL (ref 30.0–36.0)
MCHC: 30.6 g/dL (ref 30.0–36.0)
MCV: 82.9 fL (ref 80.0–100.0)
MCV: 82.2 fL (ref 80.0–100.0)
Platelets: 448 10*3/uL — ABNORMAL HIGH (ref 150–400)
Platelets: 489 10*3/uL — ABNORMAL HIGH (ref 150–400)
RBC: 3.85 MIL/uL — ABNORMAL LOW (ref 3.87–5.11)
RBC: 3.94 MIL/uL (ref 3.87–5.11)
RDW: 18.5 % — ABNORMAL HIGH (ref 11.5–15.5)
RDW: 18.4 % — ABNORMAL HIGH (ref 11.5–15.5)
WBC: 11.1 10*3/uL — ABNORMAL HIGH (ref 4.0–10.5)
WBC: 12.4 10*3/uL — ABNORMAL HIGH (ref 4.0–10.5)
nRBC: 0 % (ref 0.0–0.2)
nRBC: 0 % (ref 0.0–0.2)

## 2020-09-21 LAB — COMPREHENSIVE METABOLIC PANEL
ALT: 17 U/L (ref 0–44)
AST: 16 U/L (ref 15–41)
Albumin: 2.8 g/dL — ABNORMAL LOW (ref 3.5–5.0)
Alkaline Phosphatase: 74 U/L (ref 38–126)
Anion gap: 12 (ref 5–15)
BUN: 7 mg/dL (ref 6–20)
CO2: 23 mmol/L (ref 22–32)
Calcium: 9.3 mg/dL (ref 8.9–10.3)
Chloride: 102 mmol/L (ref 98–111)
Creatinine, Ser: 1.13 mg/dL — ABNORMAL HIGH (ref 0.44–1.00)
GFR, Estimated: >60 mL/min (ref >60)
Glucose, Bld: 102 mg/dL — ABNORMAL HIGH (ref 70–99)
Potassium: 4.4 mmol/L (ref 3.5–5.1)
Sodium: 137 mmol/L (ref 135–145)
Total Bilirubin: 0.8 mg/dL (ref 0.3–1.2)
Total Protein: 6.4 g/dL — ABNORMAL LOW (ref 6.5–8.1)

## 2020-09-21 LAB — CREATININE, SERUM
Creatinine, Ser: 0.98 mg/dL (ref 0.44–1.00)
GFR, Estimated: >60 mL/min (ref >60)

## 2020-09-21 SURGERY — CYSTOSCOPY, WITH RETROGRADE PYELOGRAM AND URETERAL STENT INSERTION
Anesthesia: General | Site: Ureter | Laterality: Bilateral

## 2020-09-21 MED ORDER — CHLORHEXIDINE GLUCONATE 0.12 % MT SOLN
OROMUCOSAL | Status: AC
  Administered 2020-09-21: 15 mL via OROMUCOSAL
  Filled 2020-09-21: qty 15

## 2020-09-21 MED ORDER — MIDAZOLAM HCL 5 MG/5ML IJ SOLN
INTRAMUSCULAR | Status: DC | PRN
  Administered 2020-09-21: 2 mg via INTRAVENOUS

## 2020-09-21 MED ORDER — MIDAZOLAM HCL 2 MG/2ML IJ SOLN
INTRAMUSCULAR | Status: AC
  Filled 2020-09-21: qty 2

## 2020-09-21 MED ORDER — DIPHENHYDRAMINE HCL 50 MG/ML IJ SOLN: 12.5000 mg | Freq: Four times a day (QID) | INTRAMUSCULAR | Status: DC | PRN

## 2020-09-21 MED ORDER — PROPOFOL 10 MG/ML IV BOLUS
INTRAVENOUS | Status: DC | PRN
  Administered 2020-09-21: 150 mg via INTRAVENOUS

## 2020-09-21 MED ORDER — DIPHENHYDRAMINE HCL 12.5 MG/5ML PO ELIX: 12.5000 mg | ORAL_SOLUTION | Freq: Four times a day (QID) | ORAL | Status: DC | PRN

## 2020-09-21 MED ORDER — ROCURONIUM BROMIDE 10 MG/ML (PF) SYRINGE
PREFILLED_SYRINGE | INTRAVENOUS | Status: AC
  Filled 2020-09-21: qty 10

## 2020-09-21 MED ORDER — HYDROMORPHONE HCL 1 MG/ML IJ SOLN
INTRAMUSCULAR | Status: DC | PRN
  Administered 2020-09-21 (×2): .5 mg via INTRAVENOUS

## 2020-09-21 MED ORDER — SODIUM CHLORIDE 0.9% FLUSH: 9.0000 mL | INTRAVENOUS | Status: DC | PRN

## 2020-09-21 MED ORDER — ENOXAPARIN SODIUM 40 MG/0.4ML ~~LOC~~ SOLN
40.0000 mg | SUBCUTANEOUS | Status: DC
  Administered 2020-09-22 – 2020-09-25 (×4): 40 mg via SUBCUTANEOUS
  Filled 2020-09-21 (×4): qty 0.4

## 2020-09-21 MED ORDER — IOHEXOL 300 MG/ML  SOLN
INTRAMUSCULAR | Status: DC | PRN
  Administered 2020-09-21: 10 mL via URETHRAL

## 2020-09-21 MED ORDER — PROMETHAZINE HCL 25 MG/ML IJ SOLN: 6.2500 mg | INTRAMUSCULAR | Status: DC | PRN

## 2020-09-21 MED ORDER — HYDROMORPHONE HCL 1 MG/ML IJ SOLN: 0.2500 mg | INTRAMUSCULAR | Status: DC | PRN

## 2020-09-21 MED ORDER — CHLORHEXIDINE GLUCONATE 0.12 % MT SOLN: 15.0000 mL | Freq: Once | OROMUCOSAL | Status: AC

## 2020-09-21 MED ORDER — BISACODYL 5 MG PO TBEC: 5.0000 mg | DELAYED_RELEASE_TABLET | Freq: Every day | ORAL | Status: DC | PRN

## 2020-09-21 MED ORDER — FENTANYL CITRATE (PF) 250 MCG/5ML IJ SOLN
INTRAMUSCULAR | Status: AC
  Filled 2020-09-21: qty 5

## 2020-09-21 MED ORDER — ORAL CARE MOUTH RINSE: 15.0000 mL | Freq: Once | OROMUCOSAL | Status: AC

## 2020-09-21 MED ORDER — IBUPROFEN 800 MG PO TABS
800.0000 mg | ORAL_TABLET | Freq: Three times a day (TID) | ORAL | Status: DC
  Administered 2020-09-21 – 2020-09-24 (×8): 800 mg via ORAL
  Filled 2020-09-21 (×8): qty 1

## 2020-09-21 MED ORDER — OXYCODONE HCL 5 MG PO TABS: 5.0000 mg | ORAL_TABLET | Freq: Once | ORAL | Status: DC | PRN

## 2020-09-21 MED ORDER — OXYCODONE HCL 5 MG/5ML PO SOLN: 5.0000 mg | Freq: Once | ORAL | Status: DC | PRN

## 2020-09-21 MED ORDER — PROPOFOL 10 MG/ML IV BOLUS
INTRAVENOUS | Status: AC
  Filled 2020-09-21: qty 20

## 2020-09-21 MED ORDER — FENTANYL CITRATE (PF) 100 MCG/2ML IJ SOLN
INTRAMUSCULAR | Status: AC
  Filled 2020-09-21: qty 2

## 2020-09-21 MED ORDER — HYDROMORPHONE 1 MG/ML IV SOLN: INTRAVENOUS | Status: DC

## 2020-09-21 MED ORDER — HYDROMORPHONE 1 MG/ML IV SOLN
INTRAVENOUS | Status: AC
  Administered 2020-09-21: 30 mg
  Filled 2020-09-21: qty 30

## 2020-09-21 MED ORDER — ONDANSETRON HCL 4 MG/2ML IJ SOLN
INTRAMUSCULAR | Status: DC | PRN
  Administered 2020-09-21: 4 mg via INTRAVENOUS

## 2020-09-21 MED ORDER — ONDANSETRON HCL 4 MG/2ML IJ SOLN
4.0000 mg | Freq: Four times a day (QID) | INTRAMUSCULAR | Status: DC | PRN
  Administered 2020-09-23: 4 mg via INTRAVENOUS
  Filled 2020-09-21: qty 2

## 2020-09-21 MED ORDER — HYDROMORPHONE HCL 1 MG/ML IJ SOLN
INTRAMUSCULAR | Status: AC
  Filled 2020-09-21: qty 0.5

## 2020-09-21 MED ORDER — OXYCODONE-ACETAMINOPHEN 5-325 MG PO TABS
1.0000 | ORAL_TABLET | Freq: Four times a day (QID) | ORAL | Status: DC | PRN
Start: 2020-09-21 — End: 2020-09-22
  Administered 2020-09-21: 2 via ORAL
  Administered 2020-09-22: 1 via ORAL
  Administered 2020-09-22: 2 via ORAL
  Administered 2020-09-22: 1 via ORAL
  Administered 2020-09-22: 2 via ORAL
  Filled 2020-09-21 (×2): qty 1
  Filled 2020-09-21 (×3): qty 2

## 2020-09-21 MED ORDER — 0.9 % SODIUM CHLORIDE (POUR BTL) OPTIME
TOPICAL | Status: DC | PRN
  Administered 2020-09-21: 1000 mL

## 2020-09-21 MED ORDER — DEXAMETHASONE SODIUM PHOSPHATE 10 MG/ML IJ SOLN
INTRAMUSCULAR | Status: DC | PRN
  Administered 2020-09-21: 10 mg via INTRAVENOUS

## 2020-09-21 MED ORDER — ONDANSETRON HCL 4 MG/2ML IJ SOLN
INTRAMUSCULAR | Status: AC
  Filled 2020-09-21: qty 2

## 2020-09-21 MED ORDER — NALOXONE HCL 0.4 MG/ML IJ SOLN: 0.4000 mg | INTRAMUSCULAR | Status: DC | PRN

## 2020-09-21 MED ORDER — MORPHINE SULFATE (PF) 2 MG/ML IV SOLN
1.0000 mg | INTRAVENOUS | Status: DC | PRN
  Administered 2020-09-21: 1 mg via INTRAVENOUS
  Filled 2020-09-21: qty 1

## 2020-09-21 MED ORDER — ONDANSETRON HCL 4 MG PO TABS
4.0000 mg | ORAL_TABLET | Freq: Four times a day (QID) | ORAL | Status: DC | PRN
  Administered 2020-09-24: 4 mg via ORAL
  Filled 2020-09-21: qty 1

## 2020-09-21 MED ORDER — LABETALOL HCL 5 MG/ML IV SOLN
INTRAVENOUS | Status: AC
  Administered 2020-09-21: 5 mg
  Filled 2020-09-21: qty 4

## 2020-09-21 MED ORDER — DEXAMETHASONE SODIUM PHOSPHATE 10 MG/ML IJ SOLN
INTRAMUSCULAR | Status: AC
  Filled 2020-09-21: qty 1

## 2020-09-21 MED ORDER — GABAPENTIN 100 MG PO CAPS
100.0000 mg | ORAL_CAPSULE | Freq: Three times a day (TID) | ORAL | Status: DC
  Administered 2020-09-21 – 2020-09-26 (×14): 100 mg via ORAL
  Filled 2020-09-21 (×15): qty 1

## 2020-09-21 MED ORDER — ACETAMINOPHEN 10 MG/ML IV SOLN
INTRAVENOUS | Status: AC
  Filled 2020-09-21: qty 100

## 2020-09-21 MED ORDER — LABETALOL HCL 5 MG/ML IV SOLN
5.0000 mg | INTRAVENOUS | Status: AC | PRN
Start: 2020-09-21 — End: 2020-09-21
  Administered 2020-09-21 (×2): 5 mg via INTRAVENOUS

## 2020-09-21 MED ORDER — ONDANSETRON HCL 4 MG/2ML IJ SOLN: 4.0000 mg | Freq: Four times a day (QID) | INTRAMUSCULAR | Status: DC | PRN

## 2020-09-21 MED ORDER — ACETAMINOPHEN 10 MG/ML IV SOLN
1000.0000 mg | Freq: Once | INTRAVENOUS | Status: DC | PRN
Start: 2020-09-21 — End: 2020-09-21
  Administered 2020-09-21: 1000 mg via INTRAVENOUS

## 2020-09-21 MED ORDER — WATER FOR IRRIGATION, STERILE IR SOLN
Status: DC | PRN
  Administered 2020-09-21: 3000 mL

## 2020-09-21 MED ORDER — FENTANYL CITRATE (PF) 250 MCG/5ML IJ SOLN
INTRAMUSCULAR | Status: DC | PRN
  Administered 2020-09-21: 100 ug via INTRAVENOUS
  Administered 2020-09-21 (×2): 50 ug via INTRAVENOUS
  Administered 2020-09-21: 100 ug via INTRAVENOUS

## 2020-09-21 MED ORDER — LIDOCAINE 2% (20 MG/ML) 5 ML SYRINGE
INTRAMUSCULAR | Status: DC | PRN
  Administered 2020-09-21: 100 mg via INTRAVENOUS

## 2020-09-21 MED ORDER — LIDOCAINE 2% (20 MG/ML) 5 ML SYRINGE
INTRAMUSCULAR | Status: AC
  Filled 2020-09-21: qty 5

## 2020-09-21 MED ORDER — ROCURONIUM BROMIDE 10 MG/ML (PF) SYRINGE
PREFILLED_SYRINGE | INTRAVENOUS | Status: DC | PRN
  Administered 2020-09-21: 60 mg via INTRAVENOUS
  Administered 2020-09-21 (×2): 20 mg via INTRAVENOUS

## 2020-09-21 MED ORDER — MIDAZOLAM HCL 2 MG/2ML IJ SOLN
INTRAMUSCULAR | Status: AC | PRN
  Administered 2020-09-21: 0.5 mg via INTRAVENOUS

## 2020-09-21 MED ORDER — SUGAMMADEX SODIUM 200 MG/2ML IV SOLN
INTRAVENOUS | Status: DC | PRN
  Administered 2020-09-21: 200 mg via INTRAVENOUS

## 2020-09-21 MED ORDER — LACTATED RINGERS IV SOLN: INTRAVENOUS | Status: DC

## 2020-09-21 MED ORDER — LABETALOL HCL 5 MG/ML IV SOLN
5.0000 mg | INTRAVENOUS | Status: DC | PRN
Start: 2020-09-21 — End: 2020-09-21
  Administered 2020-09-21: 5 mg via INTRAVENOUS

## 2020-09-21 SURGICAL SUPPLY — 40 items
BAG URINE DRAIN 2000ML AR STRL (UROLOGICAL SUPPLIES) ×2 IMPLANT
BAG URO CATCHER STRL LF (MISCELLANEOUS) IMPLANT
CATH FOLEY 2WAY SLVR  5CC 16FR (CATHETERS)
CATH FOLEY 2WAY SLVR 5CC 16FR (CATHETERS) IMPLANT
CATH INTERMIT  6FR 70CM (CATHETERS) IMPLANT
CATH SILICON 18FR 30CC (CATHETERS) ×2 IMPLANT
CLIP VESOLOCK LG 6/CT PURPLE (CLIP) ×2 IMPLANT
COVER SURGICAL LIGHT HANDLE (MISCELLANEOUS) ×2 IMPLANT
DERMABOND ADVANCED (GAUZE/BANDAGES/DRESSINGS) ×1
DERMABOND ADVANCED .7 DNX12 (GAUZE/BANDAGES/DRESSINGS) ×1 IMPLANT
DRAIN CHANNEL 15F RND FF W/TCR (WOUND CARE) ×2 IMPLANT
EVACUATOR SILICONE 100CC (DRAIN) ×2 IMPLANT
GAUZE 4X4 16PLY RFD (DISPOSABLE) ×2 IMPLANT
GLOVE BIOGEL M 8.0 STRL (GLOVE) ×2 IMPLANT
GOWN STRL REUS W/ TWL LRG LVL3 (GOWN DISPOSABLE) ×1 IMPLANT
GOWN STRL REUS W/ TWL XL LVL3 (GOWN DISPOSABLE) ×1 IMPLANT
GOWN STRL REUS W/TWL LRG LVL3 (GOWN DISPOSABLE) ×2
GOWN STRL REUS W/TWL XL LVL3 (GOWN DISPOSABLE) ×2
GUIDEWIRE ANG ZIPWIRE 038X150 (WIRE) ×2 IMPLANT
GUIDEWIRE STR DUAL SENSOR (WIRE) ×2 IMPLANT
KIT TURNOVER KIT B (KITS) ×2 IMPLANT
LOOP VESSEL MAXI BLUE (MISCELLANEOUS) ×2 IMPLANT
MANIFOLD NEPTUNE II (INSTRUMENTS) ×2 IMPLANT
NEEDLE SPNL 25GX3.5 QUINCKE BL (NEEDLE) ×2 IMPLANT
NS IRRIG 1000ML POUR BTL (IV SOLUTION) IMPLANT
PACK CYSTO (CUSTOM PROCEDURE TRAY) ×2 IMPLANT
SPONGE LAP 18X18 RF (DISPOSABLE) ×6 IMPLANT
STENT URET 6FRX24 CONTOUR (STENTS) ×2 IMPLANT
STENT URET 6FRX26 CONTOUR (STENTS) IMPLANT
SUT ETHILON 3 0 PS 1 (SUTURE) ×2 IMPLANT
SUT MON AB 4-0 PS1 27 (SUTURE) ×2 IMPLANT
SUT PDS AB 0 CT1 27 (SUTURE) ×8 IMPLANT
SUT VIC AB 2-0 SH 27 (SUTURE) ×4
SUT VIC AB 2-0 SH 27X BRD (SUTURE) ×2 IMPLANT
SUT VIC AB 4-0 RB1 27 (SUTURE) ×6
SUT VIC AB 4-0 RB1 27XBRD (SUTURE) ×3 IMPLANT
SYPHON OMNI JUG (MISCELLANEOUS) IMPLANT
TOWEL GREEN STERILE FF (TOWEL DISPOSABLE) ×2 IMPLANT
TUBE CONNECTING 12X1/4 (SUCTIONS) ×2 IMPLANT
WATER STERILE IRR 3000ML UROMA (IV SOLUTION) ×4 IMPLANT

## 2020-09-21 NOTE — Progress Notes (Signed)
Patient ID: Jenavie Arango, female   DOB: 01-26-1981, 39 y.o.   MRN: [id number]   Assessment/Plan: Active Problems:   Post-operative pain   Pelvic mass 8.5 cm in greatest diameter, and a postoperative patient who underwent TAH for a large uterus on 12/14. Continue work-up today for diagnosis. Pain is well controlled we will continue current meds. For IR drainage today with diagnostic work-up If urinoma, will consult urology  Subjective: Interval History: Feels okay this morning, reports her pain is well controlled  Objective: Vital signs in last 24 hours: Pulse Rate:  [13-110] 100 (12/31 0759) Resp:  [13-27] 15 (12/31 0759) BP: (104-151)/(65-116) 142/87 (12/31 0759) SpO2:  [95 %-100 %] 98 % (12/31 0759)  Intake/Output from previous day: No intake/output data recorded. Intake/Output this shift: Total I/O In: 200 [IV Piggyback:200] Out: -   General appearance: alert, cooperative and appears stated age Head: Normocephalic, without obvious abnormality, atraumatic Lungs: Normal effort Heart: regular rate and rhythm Abdomen: Generalized tenderness, left greater than right Extremities: Homans sign is negative, no sign of DVT Skin: Skin color, texture, turgor normal. No rashes or lesions Neurologic: Grossly normal  Results for orders placed or performed during the hospital encounter of 09/19/20 (from the past 24 hour(s))  Protime-INR     Status: None   Collection Time: 09/20/20  3:46 PM  Result Value Ref Range   Prothrombin Time 13.5 11.4 - 15.2 seconds   INR 1.1 0.8 - 1.2  APTT     Status: None   Collection Time: 09/20/20  3:46 PM  Result Value Ref Range   aPTT 31 24 - 36 seconds  Blood Culture (routine x 2)     Status: None (Preliminary result)   Collection Time: 09/20/20  3:46 PM   Specimen: BLOOD  Result Value Ref Range   Specimen Description BLOOD RIGHT ANTECUBITAL    Special Requests      BOTTLES DRAWN AEROBIC AND ANAEROBIC Blood Culture results may not be optimal  due to an inadequate volume of blood received in culture bottles   Culture      NO GROWTH < 12 HOURS Performed at [facility], [address] [place], [place] [address]    Report Status PENDING   Lactic acid, plasma     Status: None   Collection Time: 09/20/20  4:20 PM  Result Value Ref Range   Lactic Acid, Venous 0.6 0.5 - 1.9 mmol/L  Resp Panel by RT-PCR (Flu A&B, Covid) Nasopharyngeal Swab     Status: None   Collection Time: 09/20/20  9:30 PM   Specimen: Nasopharyngeal Swab; Nasopharyngeal(NP) swabs in vial transport medium  Result Value Ref Range   SARS Coronavirus 2 by RT PCR NEGATIVE NEGATIVE   Influenza A by PCR NEGATIVE NEGATIVE   Influenza B by PCR NEGATIVE NEGATIVE  Blood Culture (routine x 2)     Status: None (Preliminary result)   Collection Time: 09/20/20 10:07 PM   Specimen: BLOOD  Result Value Ref Range   Specimen Description BLOOD LEFT ANTECUBITAL    Special Requests      BOTTLES DRAWN AEROBIC AND ANAEROBIC Blood Culture adequate volume   Culture      NO GROWTH < 12 HOURS Performed at [facility], [address], [place],  [address]    Report Status PENDING   Comprehensive metabolic panel     Status: Abnormal   Collection Time: 09/21/20  4:02 AM  Result Value Ref Range   Sodium 137 135 -  145 mmol/L   Potassium 4.4 3.5 - 5.1 mmol/L   Chloride 102 98 - 111 mmol/L   CO2 23 22 - 32 mmol/L   Glucose, Bld 102 (H) 70 - 99 mg/dL   BUN 7 6 - 20 mg/dL   Creatinine, Ser 1.13 (H) 0.44 - 1.00 mg/dL   Calcium 9.3 8.9 - 10.3 mg/dL   Total Protein 6.4 (L) 6.5 - 8.1 g/dL   Albumin 2.8 (L) 3.5 - 5.0 g/dL   AST 16 15 - 41 U/L   ALT 17 0 - 44 U/L   Alkaline Phosphatase 74 38 - 126 U/L   Total Bilirubin 0.8 0.3 - 1.2 mg/dL   GFR, Estimated >60 >60 mL/min   Anion gap 12 5 - 15  CBC     Status: Abnormal   Collection Time: 09/21/20  4:02 AM  Result Value Ref Range   WBC 11.1 (H) 4.0 - 10.5 K/uL   RBC 3.85 (L) 3.87 - 5.11 MIL/uL   Hemoglobin 9.7 (L)  12.0 - 15.0 g/dL   HCT 31.9 (L) 36.0 - 46.0 %   MCV 82.9 80.0 - 100.0 fL   MCH 25.2 (L) 26.0 - 34.0 pg   MCHC 30.4 30.0 - 36.0 g/dL   RDW 18.5 (H) 11.5 - 15.5 %   Platelets 448 (H) 150 - 400 K/uL   nRBC 0.0 0.0 - 0.2 %    Studies/Results: CT ABDOMEN PELVIS W CONTRAST  Result Date: 09/20/2020 CLINICAL DATA:  Abdomen pain and possible urine leakage following hysterectomy pink fluid from vagina EXAM: CT ABDOMEN AND PELVIS WITH CONTRAST TECHNIQUE: Multidetector CT imaging of the abdomen and pelvis was performed using the standard protocol following bolus administration of intravenous contrast. CONTRAST:  147mL OMNIPAQUE IOHEXOL 300 MG/ML  SOLN COMPARISON:  None. FINDINGS: Lower chest: Lung bases demonstrate no acute consolidation or effusion. Hepatobiliary: No focal liver abnormality is seen. No gallstones, gallbladder wall thickening, or biliary dilatation. Pancreas: Unremarkable. No pancreatic ductal dilatation or surrounding inflammatory changes. Spleen: Normal in size without focal abnormality. Adrenals/Urinary Tract: Adrenal glands are normal. Moderate left and mild to moderate right hydronephrosis. There is left hydroureter. Ureter can be followed to the level of the mid pelvis and then becomes obscured by fluid collection within the pelvis, series 4, image number 72. Urinary bladder is unremarkable. The right ureter cannot be completely follow distally as well. There is delayed excretion of contrast within the left kidney consistent with decreased renal function. Stomach/Bowel: The stomach is nonenlarged no dilated small bowel. Mild thickened small bowel loops within the pelvis. Vascular/Lymphatic: Nonaneurysmal aorta.  No suspicious nodes Reproductive: Status post hysterectomy.  No adnexal mass. Other: No free air. Small amount of free fluid adjacent to the liver and spleen as well as within the bilateral lower quadrants and pelvis. Mildly rim enhancing collection superior to the bladder measuring  approximately 8.5 x 5.2 cm, it is contiguous with irregular fluid in the posterior pelvis and the fluid in the vicinity of the of distal left ureter. Fluid collection may be contiguous with the vaginal cough. Musculoskeletal: No acute or significant osseous findings. IMPRESSION: 1. 8.5 cm mildly rim enhancing fluid collection within the pelvis. Associated findings of left greater than right hydronephrosis with decreased left renal function on delayed imaging as well as the presence of small amount of abdominopelvic ascites raise concern for possible ureteral injury and urinoma. Other consideration for the pelvic fluid collection could include hematoma or postoperative collection, there is some suggestion that the fluid  collection may be contiguous with the vaginal cuff. No internal gas within the fluid collection as could be seen with abscess. 2. Thickened small bowel loops within the pelvis are likely reactive. Electronically Signed   By: Jasmine Pang M.D.   On: 09/20/2020 18:09    Scheduled Meds: . docusate sodium  100 mg Oral BID  . ketorolac  15 mg Intravenous Q6H   Continuous Infusions: . ampicillin-sulbactam (UNASYN) IV Stopped (09/21/20 0932)  . dextrose 5% lactated ringers     PRN Meds:acetaminophen, alum & mag hydroxide-simeth, bisacodyl, magnesium citrate, morphine injection, ondansetron **OR** ondansetron (ZOFRAN) IV, oxyCODONE-acetaminophen, senna-docusate, simethicone, zolpidem    LOS: 0 days   Reva Bores, MD 09/21/2020 10:00 AM

## 2020-09-21 NOTE — Consult Note (Signed)
Chief Complaint: Patient was seen in consultation today for pelvic fluid collection/aspiration with possible drain placement.  Referring Physician(s): Malachy Chamber [facility])  Supervising Physician: Malachy Moan  Patient Status: [facility] - ED  History of Present Illness: Tracy [name] is a 39 y.o. female with a past medical history of hypertension, urinary fibroids s/p hysterectomy, and anemia. Of note, patient recently underwent an abdominal hysterectomy with salpingectomy in OR 09/04/2020 by Dr. Jolayne Panther secondary to uterine fibroids. She presented to [facility] ED 09/20/2020 secondary to abdominal pain. In ED, CT abdomen/pelvis revealed pelvic fluid collection (abscess versus hematoma versus urine leak given bilateral hydronephrosis). She was admitted for further management.  CT abdomen/pelvis 09/20/2020: 1. 8.5 cm mildly rim enhancing fluid collection within the pelvis. Associated findings of left greater than right hydronephrosis with decreased left renal function on delayed imaging as well as the presence of small amount of abdominopelvic ascites raise concern for possible ureteral injury and urinoma. Other consideration for the pelvic fluid collection could include hematoma or postoperative collection, there is some suggestion that the fluid collection may be contiguous with the vaginal cuff. No internal gas within the fluid collection as could be seen with abscess. 2. Thickened small bowel loops within the pelvis are likely reactive.  IR consulted by Dr. Mayford Knife for possible image-guided pelvic fluid collection aspiration with possible drain placement. Patient awake and alert laying in bed. Complains of abdominal pain. States currently has none as she just received pain medications, however without medications rates pain as 10/10. Denies N/V associated with abdominal pain. Denies fever, chills, chest pain, dyspnea, abdominal pain, or headache.   Past Medical History:  Diagnosis Date   . Anemia   . Hypertension     Past Surgical History:  Procedure Laterality Date  . ANKLE SURGERY  2005  . CYSTOSCOPY N/A 09/04/2020   Procedure: CYSTOSCOPY;  Surgeon: Catalina Antigua, MD;  Location: [facility] OR;  Service: Gynecology;  Laterality: N/A;  . HYSTERECTOMY ABDOMINAL WITH SALPINGECTOMY N/A 09/04/2020   Procedure: HYSTERECTOMY ABDOMINAL WITH SALPINGECTOMY;  Surgeon: Catalina Antigua, MD;  Location: [facility] OR;  Service: Gynecology;  Laterality: N/A;  . KNEE SURGERY  2005   both   . OVARIAN CYST REMOVAL  2016  . OVARIAN CYST REMOVAL  2018    Allergies: Patient has no known allergies.  Medications: Prior to Admission medications   Medication Sig Start Date End Date Taking? Authorizing Provider  amoxicillin-clavulanate (AUGMENTIN) 875-125 MG tablet Take 1 tablet by mouth 2 (two) times daily. 09/19/20  Yes Willodean Rosenthal, MD  ibuprofen (ADVIL) 600 MG tablet Take 1 tablet (600 mg total) by mouth every 6 (six) hours as needed. Patient taking differently: Take 600 mg by mouth every 6 (six) hours as needed for mild pain. 09/06/20  Yes Constant, Peggy, MD  metroNIDAZOLE (FLAGYL) 500 MG tablet Take 1 tablet (500 mg total) by mouth 2 (two) times daily. 09/19/20  Yes Willodean Rosenthal, MD  oxyCODONE-acetaminophen (PERCOCET/ROXICET) 5-325 MG tablet Take 1 tablet by mouth every 4 (four) hours as needed. 09/19/20  Yes Willodean Rosenthal, MD  docusate sodium (COLACE) 100 MG capsule Take 1 capsule (100 mg total) by mouth 2 (two) times daily. Patient not taking: No sig reported 09/06/20   Constant, Peggy, MD  oxyCODONE-acetaminophen (PERCOCET/ROXICET) 5-325 MG tablet Take 1 tablet by mouth every 6 (six) hours as needed for moderate pain or severe pain. Patient not taking: No sig reported 09/06/20   Constant, Peggy, MD     Family History  Problem Relation Age  of Onset  . Diabetes Maternal Grandfather   . Hypertension Maternal Grandfather     Social History   Socioeconomic  History  . Marital status: Legally Separated    Spouse name: Not on file  . Number of children: Not on file  . Years of education: Not on file  . Highest education level: Not on file  Occupational History  . Not on file  Tobacco Use  . Smoking status: Former Research scientist (life sciences)  . Smokeless tobacco: Never Used  Substance and Sexual Activity  . Alcohol use: Yes    Comment: occassional  . Drug use: Yes    Types: Marijuana    Comment: 3 times per month when on menstrual cycle  . Sexual activity: Not Currently    Birth control/protection: None  Other Topics Concern  . Not on file  Social History Narrative  . Not on file   Social Determinants of Health   Financial Resource Strain: Not on file  Food Insecurity: Not on file  Transportation Needs: Not on file  Physical Activity: Not on file  Stress: Not on file  Social Connections: Not on file     Review of Systems: A 12 point ROS discussed and pertinent positives are indicated in the HPI above.  All other systems are negative.  Review of Systems  Constitutional: Negative for chills and fever.  Respiratory: Negative for shortness of breath and wheezing.   Cardiovascular: Negative for chest pain and palpitations.  Gastrointestinal: Positive for abdominal pain. Negative for nausea and vomiting.  Neurological: Negative for headaches.  Psychiatric/Behavioral: Negative for behavioral problems and confusion.    Vital Signs: BP (!) 142/87 (BP Location: Left Arm)   Pulse 100   Temp 98.2 F (36.8 C) (Oral)   Resp 15   Ht 5\' 9"  (1.753 m)   Wt 190 lb (86.2 kg)   LMP 08/31/2020   SpO2 98%   BMI 28.06 kg/m   Physical Exam Vitals and nursing note reviewed.  Constitutional:      General: She is not in acute distress.    Appearance: Normal appearance.  Cardiovascular:     Rate and Rhythm: Normal rate and regular rhythm.     Heart sounds: Normal heart sounds. No murmur heard.   Pulmonary:     Effort: Pulmonary effort is normal. No  respiratory distress.     Breath sounds: Normal breath sounds. No wheezing.  Skin:    General: Skin is warm and dry.  Neurological:     Mental Status: She is alert and oriented to person, place, and time.      MD Evaluation Airway: WNL Heart: WNL Abdomen: WNL Chest/ Lungs: WNL ASA  Classification: 2 Mallampati/Airway Score: Two   Imaging: CT ABDOMEN PELVIS W CONTRAST  Result Date: 09/20/2020 CLINICAL DATA:  Abdomen pain and possible urine leakage following hysterectomy pink fluid from vagina EXAM: CT ABDOMEN AND PELVIS WITH CONTRAST TECHNIQUE: Multidetector CT imaging of the abdomen and pelvis was performed using the standard protocol following bolus administration of intravenous contrast. CONTRAST:  147mL OMNIPAQUE IOHEXOL 300 MG/ML  SOLN COMPARISON:  None. FINDINGS: Lower chest: Lung bases demonstrate no acute consolidation or effusion. Hepatobiliary: No focal liver abnormality is seen. No gallstones, gallbladder wall thickening, or biliary dilatation. Pancreas: Unremarkable. No pancreatic ductal dilatation or surrounding inflammatory changes. Spleen: Normal in size without focal abnormality. Adrenals/Urinary Tract: Adrenal glands are normal. Moderate left and mild to moderate right hydronephrosis. There is left hydroureter. Ureter can be followed to the level of  the mid pelvis and then becomes obscured by fluid collection within the pelvis, series 4, image number 72. Urinary bladder is unremarkable. The right ureter cannot be completely follow distally as well. There is delayed excretion of contrast within the left kidney consistent with decreased renal function. Stomach/Bowel: The stomach is nonenlarged no dilated small bowel. Mild thickened small bowel loops within the pelvis. Vascular/Lymphatic: Nonaneurysmal aorta.  No suspicious nodes Reproductive: Status post hysterectomy.  No adnexal mass. Other: No free air. Small amount of free fluid adjacent to the liver and spleen as well as  within the bilateral lower quadrants and pelvis. Mildly rim enhancing collection superior to the bladder measuring approximately 8.5 x 5.2 cm, it is contiguous with irregular fluid in the posterior pelvis and the fluid in the vicinity of the of distal left ureter. Fluid collection may be contiguous with the vaginal cough. Musculoskeletal: No acute or significant osseous findings. IMPRESSION: 1. 8.5 cm mildly rim enhancing fluid collection within the pelvis. Associated findings of left greater than right hydronephrosis with decreased left renal function on delayed imaging as well as the presence of small amount of abdominopelvic ascites raise concern for possible ureteral injury and urinoma. Other consideration for the pelvic fluid collection could include hematoma or postoperative collection, there is some suggestion that the fluid collection may be contiguous with the vaginal cuff. No internal gas within the fluid collection as could be seen with abscess. 2. Thickened small bowel loops within the pelvis are likely reactive. Electronically Signed   By: Donavan Foil M.D.   On: 09/20/2020 18:09    Labs:  CBC: Recent Labs    09/17/20 1944 09/19/20 1416 09/19/20 2037 09/21/20 0402  WBC 9.9 7.5 10.7* 11.1*  HGB 10.8* 10.4* 10.4* 9.7*  HCT 34.9* 33.9* 34.1* 31.9*  PLT 512* 522* 500* 448*    COAGS: Recent Labs    09/20/20 1546  INR 1.1  APTT 31    BMP: Recent Labs    09/17/20 1833 09/17/20 1944 09/19/20 2037 09/21/20 0402  NA 135 140 137 137  K 3.9 4.5 3.8 4.4  CL 102 105 103 102  CO2 23 26 22 23   GLUCOSE 105* 105* 112* 102*  BUN 9 11 10 7   CALCIUM 9.4 9.5 9.5 9.3  CREATININE 1.30* 1.28* 1.22* 1.13*  GFRNONAA 54* 55* 58* >60    LIVER FUNCTION TESTS: Recent Labs    09/17/20 1944 09/19/20 2037 09/21/20 0402  BILITOT 0.5 0.4 0.8  AST 18 16 16   ALT 18 17 17   ALKPHOS 69 69 74  PROT 7.9 7.2 6.4*  ALBUMIN 4.0 3.4* 2.8*     Assessment and Plan:  History of abdominal  hysterectomy with salpingectomy in OR [id number] complicated by develop of pelvic fluid collection. Plan for image-guided pelvic fluid collection aspiration with possible drain placement today in IR. Patient is aware that this could be a abscess versus hematoma versus urine leak- if urine leak she is aware she might need additional procedures in the future. Patient is NPO. Afebrile. She does not take blood thinners. INR 1.1 today.  Risks and benefits discussed with the patient including bleeding, infection, damage to adjacent structures, bowel perforation/fistula connection, and sepsis. All of the patient's questions were answered, patient is agreeable to proceed. Consent signed and in IR control room.   Thank you for this interesting consult.  I greatly enjoyed meeting Decima Wigand and look forward to participating in their care.  A copy of this report was sent to the  requesting provider on this date.  Electronically Signed: Earley Abide, PA-C 09/21/2020, 10:18 AM   I spent a total of 40 Minutes in face to face in clinical consultation, greater than 50% of which was counseling/coordinating care for pelvic fluid collection/aspiration with possible drain placement.

## 2020-09-21 NOTE — Brief Op Note (Signed)
09/19/2020 - 09/21/2020  7:07 PM  PATIENT:  Tracy [name]  39 y.o. female  PRE-OPERATIVE DIAGNOSIS:  URINOMA  POST-OPERATIVE DIAGNOSIS:  URINOMA  PROCEDURE:  Procedure(s) with comments: CYSTOSCOPY WITH BILATERAL RETROGRADE LEFT URETERAL STENT PLACEMENT (Bilateral) OPEN URETERAL REIMPLANT (Bilateral) - Abdominal Approach  SURGEON:  Surgeon(s) and Role:    * Sebastian Ache, MD - Primary    * Constant, Peggy, MD  PHYSICIAN ASSISTANT:   ASSISTANTS: none   ANESTHESIA:   general  EBL:    BLOOD ADMINISTERED:none  DRAINS: 1 - JP to bulb; 2 - Foley to gravity   LOCAL MEDICATIONS USED:  NONE  SPECIMEN:  No Specimen  DISPOSITION OF SPECIMEN:  N/A  COUNTS:  YES  TOURNIQUET:  * No tourniquets in log *  DICTATION: .Other Dictation: Dictation Number  [id number]  PLAN OF CARE: Admit to inpatient   PATIENT DISPOSITION:  PACU - hemodynamically stable.   Delay start of Pharmacological VTE agent (>24hrs) due to surgical blood loss or risk of bleeding: yes

## 2020-09-21 NOTE — Anesthesia Preprocedure Evaluation (Signed)
Anesthesia Evaluation  Patient identified by MRN, date of birth, ID band Patient awake    Reviewed: Allergy & Precautions, H&P , NPO status , Patient's Chart, lab work & pertinent test results  Airway Mallampati: II  TM Distance: >3 FB Neck ROM: Full    Dental no notable dental hx.    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  negative genitourinary   Musculoskeletal negative musculoskeletal ROS (+)   Abdominal   Peds negative pediatric ROS (+)  Hematology  (+) anemia ,   Anesthesia Other Findings   Reproductive/Obstetrics negative OB ROS                             Anesthesia Physical Anesthesia Plan  ASA: III  Anesthesia Plan: General   Post-op Pain Management:    Induction: Intravenous  PONV Risk Score and Plan: 3 and Ondansetron, Dexamethasone and Treatment may vary due to age or medical condition  Airway Management Planned: Oral ETT  Additional Equipment:   Intra-op Plan:   Post-operative Plan: Extubation in OR  Informed Consent: I have reviewed the patients History and Physical, chart, labs and discussed the procedure including the risks, benefits and alternatives for the proposed anesthesia with the patient or authorized representative who has indicated his/her understanding and acceptance.     Dental advisory given  Plan Discussed with: CRNA and Surgeon  Anesthesia Plan Comments:         Anesthesia Quick Evaluation

## 2020-09-21 NOTE — Anesthesia Procedure Notes (Signed)
Procedure Name: Intubation Date/Time: 09/21/2020 4:17 PM Performed by: Amadeo Garnet, CRNA Pre-anesthesia Checklist: Patient identified, Emergency Drugs available, Suction available and Patient being monitored Patient Re-evaluated:Patient Re-evaluated prior to induction Oxygen Delivery Method: Circle system utilized Preoxygenation: Pre-oxygenation with 100% oxygen Induction Type: IV induction Ventilation: Mask ventilation without difficulty Laryngoscope Size: Mac and 3 Grade View: Grade I Tube type: Oral Number of attempts: 1 Airway Equipment and Method: Stylet and Oral airway Placement Confirmation: ETT inserted through vocal cords under direct vision,  positive ETCO2 and breath sounds checked- equal and bilateral Secured at: 22 cm Tube secured with: Tape Dental Injury: Teeth and Oropharynx as per pre-operative assessment

## 2020-09-21 NOTE — Anesthesia Postprocedure Evaluation (Signed)
Anesthesia Post Note  Patient: Storie Heffern  Procedure(s) Performed: CYSTOSCOPY WITH BILATERAL RETROGRADE LEFT URETERAL STENT PLACEMENT (Bilateral Ureter) OPEN URETERAL REIMPLANT (Bilateral Ureter)     Patient location during evaluation: PACU Anesthesia Type: General Level of consciousness: awake and alert Pain management: pain level controlled Vital Signs Assessment: post-procedure vital signs reviewed and stable Respiratory status: spontaneous breathing, nonlabored ventilation, respiratory function stable and patient connected to nasal cannula oxygen Cardiovascular status: blood pressure returned to baseline and stable Postop Assessment: no apparent nausea or vomiting Anesthetic complications: no   No complications documented.  Last Vitals:  Vitals:   09/21/20 2045 09/21/20 2100  BP: (!) 147/101 (!) 143/104  Pulse: 97 100  Resp: 13 15  Temp: 36.6 C 36.8 C  SpO2: 98% 100%    Last Pain:  Vitals:   09/21/20 2100  TempSrc: Oral  PainSc:                  Sanchez Hemmer COKER

## 2020-09-21 NOTE — Sedation Documentation (Signed)
Dr. Archer Asa in to see pt.  Procedure cancelled Urology will be consulted by Dr. Bailey Mech

## 2020-09-21 NOTE — Consult Note (Signed)
Reason for Consult: Rule Out Ureteral / Bladder Injury  Referring Physician:   [name] is an 39 y.o. female.  HPI:   1 - Urinoma / Bilateral Hydronephrosis / Rule Out Ureteral / Bladder Injury - s/p open hysterectomy / BSO 09/04/20 with worsening abdominal pain, pelvic fluid collection and bilateral hydro on ER CT 09/20/20. Endorses leaking uriniferous fluid per vagina. Cr 1.1, Hgb 9.7. FU CT with contrast in abd c/w urinary extravasation, favored distal left ureter pre report.   PMH sig for mild chronic anemia, lap ovarian cyst removal, ortho surgery, mild obesity. No CV disease / blood thinners. Her PCP is Dr. Jerilynn [name]. Tracy [name] with [facility].   Today "Tracy [name]" is seen for evaluation of above.   Past Medical History:  Diagnosis Date  . Anemia   . Hypertension     Past Surgical History:  Procedure Laterality Date  . ANKLE SURGERY  2005  . CYSTOSCOPY N/A 09/04/2020   Procedure: CYSTOSCOPY;  Surgeon: Mora Bellman, MD;  Location: [place];  Service: Gynecology;  Laterality: N/A;  . HYSTERECTOMY ABDOMINAL WITH SALPINGECTOMY N/A 09/04/2020   Procedure: HYSTERECTOMY ABDOMINAL WITH SALPINGECTOMY;  Surgeon: Mora Bellman, MD;  Location: [place];  Service: Gynecology;  Laterality: N/A;  . KNEE SURGERY  2005   both   . OVARIAN CYST REMOVAL  2016  . OVARIAN CYST REMOVAL  2018    Family History  Problem Relation Age of Onset  . Diabetes Maternal Grandfather   . Hypertension Maternal Grandfather     Social History:  reports that she has quit smoking. She has never used smokeless tobacco. She reports current alcohol use. She reports current drug use. Drug: Marijuana.  Allergies: No Known Allergies  Medications: I have reviewed the patient's current medications.  Results for orders placed or performed during the hospital encounter of 09/19/20 (from the past 48 hour(s))  Urinalysis, Routine w reflex microscopic Urine, Clean Catch     Status: Abnormal   Collection Time: 09/19/20  8:29 PM   Result Value Ref Range   Color, Urine AMBER (A) YELLOW    Comment: BIOCHEMICALS MAY BE AFFECTED BY COLOR   APPearance HAZY (A) CLEAR   Specific Gravity, Urine 1.030 1.005 - 1.030   pH 5.0 5.0 - 8.0   Glucose, UA NEGATIVE NEGATIVE mg/dL   Hgb urine dipstick MODERATE (A) NEGATIVE   Bilirubin Urine NEGATIVE NEGATIVE   Ketones, ur 80 (A) NEGATIVE mg/dL   Protein, ur 100 (A) NEGATIVE mg/dL   Nitrite NEGATIVE NEGATIVE   Leukocytes,Ua MODERATE (A) NEGATIVE   RBC / HPF >50 (H) 0 - 5 RBC/hpf   WBC, UA >50 (H) 0 - 5 WBC/hpf   Bacteria, UA RARE (A) NONE SEEN   Squamous Epithelial / LPF 11-20 0 - 5   Mucus PRESENT     Comment: Performed at [facility], [address], [place], [place] [address]  Lipase, blood     Status: None   Collection Time: 09/19/20  8:37 PM  Result Value Ref Range   Lipase 24 11 - 51 U/L    Comment: Performed at [facility], [place] [address], [place], [place] [address]  Comprehensive metabolic panel     Status: Abnormal   Collection Time: 09/19/20  8:37 PM  Result Value Ref Range   Sodium 137 135 - 145 mmol/L   Potassium 3.8 3.5 - 5.1 mmol/L   Chloride 103 98 - 111 mmol/L   CO2 22 22 - 32 mmol/L  Glucose, Bld 112 (H) 70 - 99 mg/dL    Comment: Glucose reference range applies only to samples taken after fasting for at least 8 hours.   BUN 10 6 - 20 mg/dL   Creatinine, Ser 9.38 (H) 0.44 - 1.00 mg/dL   Calcium 9.5 8.9 - 10.1 mg/dL   Total Protein 7.2 6.5 - 8.1 g/dL   Albumin 3.4 (L) 3.5 - 5.0 g/dL   AST 16 15 - 41 U/L   ALT 17 0 - 44 U/L   Alkaline Phosphatase 69 38 - 126 U/L   Total Bilirubin 0.4 0.3 - 1.2 mg/dL   GFR, Estimated 58 (L) >60 mL/min    Comment: (NOTE) Calculated using the CKD-EPI Creatinine Equation (2021)    Anion gap 12 5 - 15    Comment: Performed at [facility], [address], [place], [place] [address]  CBC     Status: Abnormal   Collection Time: 09/19/20  8:37 PM  Result Value Ref Range   WBC 10.7 (H) 4.0 - 10.5 K/uL    RBC 4.12 3.87 - 5.11 MIL/uL   Hemoglobin 10.4 (L) 12.0 - 15.0 g/dL   HCT 58.5 (L) 27.7 - 82.4 %   MCV 82.8 80.0 - 100.0 fL   MCH 25.2 (L) 26.0 - 34.0 pg   MCHC 30.5 30.0 - 36.0 g/dL   RDW 23.5 (H) 36.1 - 44.3 %   Platelets 500 (H) 150 - 400 K/uL   nRBC 0.0 0.0 - 0.2 %    Comment: Performed at [facility], [address] [place], [place] [address]  I-Stat beta hCG blood, ED     Status: None   Collection Time: 09/19/20  9:31 PM  Result Value Ref Range   I-stat hCG, quantitative <5.0 <5 mIU/mL   Comment 3            Comment:   GEST. AGE      CONC.  (mIU/mL)   <=1 WEEK        5 - 50     2 WEEKS       50 - 500     3 WEEKS       100 - 10,000     4 WEEKS     1,000 - 30,000        FEMALE AND NON-PREGNANT FEMALE:     LESS THAN 5 mIU/mL   Protime-INR     Status: None   Collection Time: 09/20/20  3:46 PM  Result Value Ref Range   Prothrombin Time 13.5 11.4 - 15.2 seconds   INR 1.1 0.8 - 1.2    Comment: (NOTE) INR goal varies based on device and disease states. Performed at [facility], [address] [place], [place] [address]   APTT     Status: None   Collection Time: 09/20/20  3:46 PM  Result Value Ref Range   aPTT 31 24 - 36 seconds    Comment: Performed at [facility], [address] [place], [place] [address]  Blood Culture (routine x 2)     Status: None (Preliminary result)   Collection Time: 09/20/20  3:46 PM   Specimen: BLOOD  Result Value Ref Range   Specimen Description BLOOD RIGHT ANTECUBITAL    Special Requests      BOTTLES DRAWN AEROBIC AND ANAEROBIC Blood Culture results may not be optimal due to an inadequate volume of blood received in culture bottles   Culture  NO GROWTH < 12 HOURS Performed at [facility] [address] [place], [place] [address]    Report Status PENDING   Lactic acid, plasma     Status: None   Collection Time: 09/20/20  4:20 PM  Result Value Ref Range   Lactic Acid, Venous 0.6 0.5 - 1.9 mmol/L     Comment: Performed at [facility] [address], [place], [place] [address]  Resp Panel by RT-PCR (Flu A&B, Covid) Nasopharyngeal Swab     Status: None   Collection Time: 09/20/20  9:30 PM   Specimen: Nasopharyngeal Swab; Nasopharyngeal(NP) swabs in vial transport medium  Result Value Ref Range   SARS Coronavirus 2 by RT PCR NEGATIVE NEGATIVE    Comment: (NOTE) SARS-CoV-2 target nucleic acids are NOT DETECTED.  The SARS-CoV-2 RNA is generally detectable in upper respiratory specimens during the acute phase of infection. The lowest concentration of SARS-CoV-2 viral copies this assay can detect is 138 copies/mL. A negative result does not preclude SARS-Cov-2 infection and should not be used as the sole basis for treatment or other patient management decisions. A negative result may occur with  improper specimen collection/handling, submission of specimen other than nasopharyngeal swab, presence of viral mutation(s) within the areas targeted by this assay, and inadequate number of viral copies(<138 copies/mL). A negative result must be combined with clinical observations, patient history, and epidemiological information. The expected result is Negative.  Fact Sheet for Patients:  EntrepreneurPulse.com.au  Fact Sheet for Healthcare Providers:  IncredibleEmployment.be  This test is no t yet approved or cleared by the [place] FDA and  has been authorized for detection and/or diagnosis of SARS-CoV-2 by FDA under an Emergency Use Authorization (EUA). This EUA will remain  in effect (meaning this test can be used) for the duration of the COVID-19 declaration under Section 564(b)(1) of the Act, 21 U.S.C.section 360bbb-3(b)(1), unless the authorization is terminated  or revoked sooner.       Influenza A by PCR NEGATIVE NEGATIVE   Influenza B by PCR NEGATIVE NEGATIVE    Comment: (NOTE) The Xpert Xpress SARS-CoV-2/FLU/RSV plus assay is  intended as an aid in the diagnosis of influenza from Nasopharyngeal swab specimens and should not be used as a sole basis for treatment. Nasal washings and aspirates are unacceptable for Xpert Xpress SARS-CoV-2/FLU/RSV testing.  Fact Sheet for Patients: EntrepreneurPulse.com.au  Fact Sheet for Healthcare Providers: IncredibleEmployment.be  This test is not yet approved or cleared by the [place] FDA and has been authorized for detection and/or diagnosis of SARS-CoV-2 by FDA under an Emergency Use Authorization (EUA). This EUA will remain in effect (meaning this test can be used) for the duration of the COVID-19 declaration under Section 564(b)(1) of the Act, 21 U.S.C. section 360bbb-3(b)(1), unless the authorization is terminated or revoked.  Performed at [facility], [place] [address] [place], [place] [address]   Blood Culture (routine x 2)     Status: None (Preliminary result)   Collection Time: 09/20/20 10:07 PM   Specimen: BLOOD  Result Value Ref Range   Specimen Description BLOOD LEFT ANTECUBITAL    Special Requests      BOTTLES DRAWN AEROBIC AND ANAEROBIC Blood Culture adequate volume   Culture      NO GROWTH < 12 HOURS Performed at [facility], [place] [address], [place],  [address]    Report Status PENDING   Comprehensive metabolic panel     Status: Abnormal   Collection  Time: 09/21/20  4:02 AM  Result Value Ref Range   Sodium 137 135 - 145 mmol/L   Potassium 4.4 3.5 - 5.1 mmol/L   Chloride 102 98 - 111 mmol/L   CO2 23 22 - 32 mmol/L   Glucose, Bld 102 (H) 70 - 99 mg/dL    Comment: Glucose reference range applies only to samples taken after fasting for at least 8 hours.   BUN 7 6 - 20 mg/dL   Creatinine, Ser 1.13 (H) 0.44 - 1.00 mg/dL   Calcium 9.3 8.9 - 10.3 mg/dL   Total Protein 6.4 (L) 6.5 - 8.1 g/dL   Albumin 2.8 (L) 3.5 - 5.0 g/dL   AST 16 15 - 41 U/L   ALT 17 0 - 44 U/L   Alkaline Phosphatase 74  38 - 126 U/L   Total Bilirubin 0.8 0.3 - 1.2 mg/dL   GFR, Estimated >60 >60 mL/min    Comment: (NOTE) Calculated using the CKD-EPI Creatinine Equation (2021)    Anion gap 12 5 - 15    Comment: Performed at [facility], [place] [address], [place], [place] [address]  CBC     Status: Abnormal   Collection Time: 09/21/20  4:02 AM  Result Value Ref Range   WBC 11.1 (H) 4.0 - 10.5 K/uL   RBC 3.85 (L) 3.87 - 5.11 MIL/uL   Hemoglobin 9.7 (L) 12.0 - 15.0 g/dL   HCT 31.9 (L) 36.0 - 46.0 %   MCV 82.9 80.0 - 100.0 fL   MCH 25.2 (L) 26.0 - 34.0 pg   MCHC 30.4 30.0 - 36.0 g/dL   RDW 18.5 (H) 11.5 - 15.5 %   Platelets 448 (H) 150 - 400 K/uL   nRBC 0.0 0.0 - 0.2 %    Comment: Performed at [facility], [place] [address], [place], [place] [address]    CT ABDOMEN PELVIS W CONTRAST  Result Date: 09/20/2020 CLINICAL DATA:  Abdomen pain and possible urine leakage following hysterectomy pink fluid from vagina EXAM: CT ABDOMEN AND PELVIS WITH CONTRAST TECHNIQUE: Multidetector CT imaging of the abdomen and pelvis was performed using the standard protocol following bolus administration of intravenous contrast. CONTRAST:  155mL OMNIPAQUE IOHEXOL 300 MG/ML  SOLN COMPARISON:  None. FINDINGS: Lower chest: Lung bases demonstrate no acute consolidation or effusion. Hepatobiliary: No focal liver abnormality is seen. No gallstones, gallbladder wall thickening, or biliary dilatation. Pancreas: Unremarkable. No pancreatic ductal dilatation or surrounding inflammatory changes. Spleen: Normal in size without focal abnormality. Adrenals/Urinary Tract: Adrenal glands are normal. Moderate left and mild to moderate right hydronephrosis. There is left hydroureter. Ureter can be followed to the level of the mid pelvis and then becomes obscured by fluid collection within the pelvis, series 4, image number 72. Urinary bladder is unremarkable. The right ureter cannot be completely follow distally as well. There is delayed  excretion of contrast within the left kidney consistent with decreased renal function. Stomach/Bowel: The stomach is nonenlarged no dilated small bowel. Mild thickened small bowel loops within the pelvis. Vascular/Lymphatic: Nonaneurysmal aorta.  No suspicious nodes Reproductive: Status post hysterectomy.  No adnexal mass. Other: No free air. Small amount of free fluid adjacent to the liver and spleen as well as within the bilateral lower quadrants and pelvis. Mildly rim enhancing collection superior to the bladder measuring approximately 8.5 x 5.2 cm, it is contiguous with irregular fluid in the posterior pelvis and the fluid in the vicinity of the of distal left ureter. Fluid collection  may be contiguous with the vaginal cough. Musculoskeletal: No acute or significant osseous findings. IMPRESSION: 1. 8.5 cm mildly rim enhancing fluid collection within the pelvis. Associated findings of left greater than right hydronephrosis with decreased left renal function on delayed imaging as well as the presence of small amount of abdominopelvic ascites raise concern for possible ureteral injury and urinoma. Other consideration for the pelvic fluid collection could include hematoma or postoperative collection, there is some suggestion that the fluid collection may be contiguous with the vaginal cuff. No internal gas within the fluid collection as could be seen with abscess. 2. Thickened small bowel loops within the pelvis are likely reactive. Electronically Signed   By: Donavan Foil M.D.   On: 09/20/2020 18:09    Review of Systems  Constitutional: Negative for chills and fever.  All other systems reviewed and are negative.  Blood pressure (!) 142/87, pulse 100, temperature 98.2 F (36.8 C), temperature source Oral, resp. rate 15, height 5\' 9"  (1.753 m), weight 86.2 kg, last menstrual period 08/31/2020, SpO2 98 %. Physical Exam Vitals reviewed.  Constitutional:      Comments: Very pleasant.   Cardiovascular:      Comments: Nl rate at present.  Pulmonary:     Effort: Pulmonary effort is normal.  Abdominal:     General: Abdomen is flat. Bowel sounds are normal.     Comments: Mild truncal obesity w/o distension  Genitourinary:    Comments: No CVAT.  Neurological:     General: No focal deficit present.     Mental Status: She is alert.  Psychiatric:        Mood and Affect: Mood normal.     Assessment/Plan:  1 - Urinoma / Bilateral Hydronephrosis / Rule Out Ureteral / Bladder Injury -rec operative cysto, BILATERAL retrogrades, possible BILATERAL stents, possible open ureteral reimplantation. Risks, benefits, alternatives, expected peri-op course discussed. Remain NPO.     Alexis Frock 09/21/2020, 12:00 PM

## 2020-09-21 NOTE — Transfer of Care (Signed)
Immediate Anesthesia Transfer of Care Note  Patient: Tracy [name]  Procedure(s) Performed: CYSTOSCOPY WITH BILATERAL RETROGRADE LEFT URETERAL STENT PLACEMENT (Bilateral Ureter) OPEN URETERAL REIMPLANT (Bilateral Ureter)  Patient Location: PACU  Anesthesia Type:General  Level of Consciousness: awake, alert  and oriented  Airway & Oxygen Therapy: Patient Spontanous Breathing  Post-op Assessment: Report given to RN, Post -op Vital signs reviewed and stable and Patient moving all extremities  Post vital signs: Reviewed and stable  Last Vitals:  Vitals Value Taken Time  BP 138/125 09/21/20 2048  Temp 36.6 C 09/21/20 2045  Pulse 101 09/21/20 2053  Resp 16 09/21/20 2053  SpO2 100 % 09/21/20 2053  Vitals shown include unvalidated device data.  Last Pain:  Vitals:   09/21/20 2035  TempSrc:   PainSc: 8       Patients Stated Pain Goal: 3 (09/21/20 1535)  Complications: No complications documented.

## 2020-09-21 NOTE — ED Notes (Signed)
Patient transported to IR 

## 2020-09-21 NOTE — Progress Notes (Addendum)
Patient returned from OR c/o dilaudid PCA is not working and prefers Microbiologist and advil. Kpad also applied. Attending paged. Orders received. Patient updated.  Sim Boast, RN

## 2020-09-21 NOTE — Progress Notes (Signed)
Pt dilauded PCA was wasted 27cc  with [employer] RN

## 2020-09-22 ENCOUNTER — Encounter (HOSPITAL_COMMUNITY): Payer: Self-pay | Admitting: Urology

## 2020-09-22 LAB — URINE CULTURE

## 2020-09-22 LAB — BASIC METABOLIC PANEL
Anion gap: 13 (ref 5–15)
BUN: 5 mg/dL — ABNORMAL LOW (ref 6–20)
CO2: 24 mmol/L (ref 22–32)
Calcium: 8.9 mg/dL (ref 8.9–10.3)
Chloride: 100 mmol/L (ref 98–111)
Creatinine, Ser: 0.84 mg/dL (ref 0.44–1.00)
GFR, Estimated: >60 mL/min (ref >60)
Glucose, Bld: 150 mg/dL — ABNORMAL HIGH (ref 70–99)
Potassium: 4.3 mmol/L (ref 3.5–5.1)
Sodium: 137 mmol/L (ref 135–145)

## 2020-09-22 LAB — CBC
HCT: 30 % — ABNORMAL LOW (ref 36.0–46.0)
Hemoglobin: 9.1 g/dL — ABNORMAL LOW (ref 12.0–15.0)
MCH: 25.1 pg — ABNORMAL LOW (ref 26.0–34.0)
MCHC: 30.3 g/dL (ref 30.0–36.0)
MCV: 82.6 fL (ref 80.0–100.0)
Platelets: 484 10*3/uL — ABNORMAL HIGH (ref 150–400)
RBC: 3.63 MIL/uL — ABNORMAL LOW (ref 3.87–5.11)
RDW: 18.3 % — ABNORMAL HIGH (ref 11.5–15.5)
WBC: 10.8 10*3/uL — ABNORMAL HIGH (ref 4.0–10.5)
nRBC: 0 % (ref 0.0–0.2)

## 2020-09-22 MED ORDER — OXYCODONE-ACETAMINOPHEN 5-325 MG PO TABS
1.0000 | ORAL_TABLET | ORAL | Status: DC | PRN
  Administered 2020-09-22 – 2020-09-26 (×17): 2 via ORAL
  Filled 2020-09-22 (×17): qty 2

## 2020-09-22 NOTE — Progress Notes (Signed)
    Gynecology Progress Note  Admission Date: 09/19/2020 Current Date: 09/22/2020 7:28 AM  Tracy [name] is a 40 y.o. G2P2001 HD#2 admitted for cytoscopy with bilateral retrograde left ureteral stent placement and open ureteral reimplant   History complicated by: Patient Active Problem List   Diagnosis Date Noted  . Abdominal pain 09/21/2020  . Post-operative pain 09/20/2020  . S/P abdominal hysterectomy 09/04/2020    Subjective:  Patient feeling okay, states pain is better controlled on percocet/motrin than dilaudid and was switched to this overnight. Manageable on this regimen. She is tolerating liquids, denies nausea/vomiting. Denies chest pain, shortness of breath. Not up and ambulating yet. She is sore.  Objective:   Vitals:   09/21/20 2100 09/21/20 2303 09/22/20 0046 09/22/20 0515  BP: (!) 143/104 (!) 149/104 (!) 133/98 122/83  Pulse: 100 100 (!) 105 100  Resp: 15 16 17 17   Temp: 98.3 F (36.8 C) 97.8 F (36.6 C) 98.1 F (36.7 C) 98.5 F (36.9 C)  TempSrc: Oral Oral Oral Oral  SpO2: 100% 100% 100% 100%  Weight:      Height:        No intake/output data recorded.  Intake/Output Summary (Last 24 hours) at 09/22/2020 0728 Last data filed at 09/22/2020 0555 Gross per 24 hour  Intake 2200 ml  Output 1510 ml  Net 690 ml     Physical exam: BP 122/83 (BP Location: Left Arm)   Pulse 100   Temp 98.5 F (36.9 C) (Oral)   Resp 17   Ht 5\' 9"  (1.753 m)   Wt 89.4 kg   LMP 08/31/2020   SpO2 100%   BMI 29.11 kg/m  CONSTITUTIONAL: Well-developed, well-nourished female in no acute distress.  HENT:  Normocephalic, atraumatic, External right and left ear normal. Oropharynx is clear and moist EYES: Conjunctivae and EOM are normal. Pupils are equal, round, and reactive to light. No scleral icterus.  NECK: Normal range of motion, supple, no masses.  Normal thyroid.  SKIN: Skin is warm and dry. No rash noted. Not diaphoretic. No erythema. No pallor. NEUROLOGIC: Alert and  oriented to person, place, and time. Normal reflexes, muscle tone coordination. No cranial nerve deficit noted. PSYCHIATRIC: Normal mood and affect. Normal behavior. Normal judgment and thought content. CARDIOVASCULAR: Normal heart rate noted RESPIRATORY: Effort normal, no problems with respiration noted. ABDOMEN: Soft,  no distention noted. incision clean/dry/intact with no evidence of active bleeding, appropriately tender, no rebound or guarding.  PELVIC: deferred MUSCULOSKELETAL: Normal range of motion. No tenderness.  No cyanosis, clubbing, or edema.    UOP: 500 mL overnight JP: 50 mL overnight  Labs  Recent Labs  Lab 09/21/20 0402 09/21/20 2153 09/22/20 0326  WBC 11.1* 12.4* 10.8*  HGB 9.7* 9.9* 9.1*  HCT 31.9* 32.4* 30.0*  PLT 448* 489* 484*     Assessment & Plan:   Patient is 40 y.o. G2P2001 POD#1 presenting with abdominal pain, admitted for cytoscopy with bilateral retrograde left ureteral stent placement and open ureteral reimplant. She is doing fairly well, pain managed on PO meds since last night. Drains with adequate output.   Pain meds prn Advance diet as tolerated lovenox ppx Foley and JP per Urology, appreciate excellent care     K. 11/20/20, MD, [facility] Attending Center for [facility])

## 2020-09-22 NOTE — Progress Notes (Signed)
1 Day Post-Op Subjective: Patient reports feeling better, having appropriate incisional pain.  Objective: Vital signs in last 24 hours: Temp:  [97.6 F (36.4 C)-98.7 F (37.1 C)] 98.5 F (36.9 C) (01/01 0515) Pulse Rate:  [95-115] 100 (01/01 0515) Resp:  [11-18] 17 (01/01 0515) BP: (122-153)/(83-109) 122/83 (01/01 0515) SpO2:  [91 %-100 %] 100 % (01/01 0515) Weight:  [89.4 kg] 89.4 kg (12/31 1300)  Intake/Output from previous day: 12/31 0701 - 01/01 0700 In: 2200 [P.O.:600; I.V.:1400; IV Piggyback:200] Out: 1510 [Urine:900; Drains:460; Blood:150] Intake/Output this shift: No intake/output data recorded.  Physical Exam:  General:alert, cooperative and no distress GI: not done and soft Female genitalia: not done   Lab Results: Recent Labs    09/21/20 0402 09/21/20 2153 09/22/20 0326  HGB 9.7* 9.9* 9.1*  HCT 31.9* 32.4* 30.0*   BMET Recent Labs    09/21/20 0402 09/21/20 2153 09/22/20 0326  NA 137  --  137  K 4.4  --  4.3  CL 102  --  100  CO2 23  --  24  GLUCOSE 102*  --  150*  BUN 7  --  5*  CREATININE 1.13* 0.98 0.84  CALCIUM 9.3  --  8.9   Recent Labs    09/20/20 1546  INR 1.1   No results for input(s): LABURIN in the last 72 hours. Results for orders placed or performed during the hospital encounter of 09/19/20  Blood Culture (routine x 2)     Status: None (Preliminary result)   Collection Time: 09/20/20  3:46 PM   Specimen: BLOOD  Result Value Ref Range Status   Specimen Description BLOOD RIGHT ANTECUBITAL  Final   Special Requests   Final    BOTTLES DRAWN AEROBIC AND ANAEROBIC Blood Culture results may not be optimal due to an inadequate volume of blood received in culture bottles   Culture   Final    NO GROWTH 2 DAYS Performed at [facility], [address], [place], [place] [address]    Report Status PENDING  Incomplete  Urine culture     Status: Abnormal   Collection Time: 09/20/20  8:56 PM   Specimen: In/Out Cath Urine  Result  Value Ref Range Status   Specimen Description IN/OUT CATH URINE  Final   Special Requests   Final    NONE Performed at [facility], [address], [place], [place] [address]    Culture MULTIPLE SPECIES PRESENT, SUGGEST RECOLLECTION (A)  Final   Report Status 09/22/2020 FINAL  Final  Resp Panel by RT-PCR (Flu A&B, Covid) Nasopharyngeal Swab     Status: None   Collection Time: 09/20/20  9:30 PM   Specimen: Nasopharyngeal Swab; Nasopharyngeal(NP) swabs in vial transport medium  Result Value Ref Range Status   SARS Coronavirus 2 by RT PCR NEGATIVE NEGATIVE Final    Comment: (NOTE) SARS-CoV-2 target nucleic acids are NOT DETECTED.  The SARS-CoV-2 RNA is generally detectable in upper respiratory specimens during the acute phase of infection. The lowest concentration of SARS-CoV-2 viral copies this assay can detect is 138 copies/mL. A negative result does not preclude SARS-Cov-2 infection and should not be used as the sole basis for treatment or other patient management decisions. A negative result may occur with  improper specimen collection/handling, submission of specimen other than nasopharyngeal swab, presence of viral mutation(s) within the areas targeted by this assay, and inadequate number of viral copies(<138 copies/mL). A negative result must be combined with clinical observations, patient  history, and epidemiological information. The expected result is Negative.  Fact Sheet for Patients:  EntrepreneurPulse.com.au  Fact Sheet for Healthcare Providers:  IncredibleEmployment.be  This test is no t yet approved or cleared by the [place] FDA and  has been authorized for detection and/or diagnosis of SARS-CoV-2 by FDA under an Emergency Use Authorization (EUA). This EUA will remain  in effect (meaning this test can be used) for the duration of the COVID-19 declaration under Section 564(b)(1) of the Act, 21 U.S.C.section  360bbb-3(b)(1), unless the authorization is terminated  or revoked sooner.       Influenza A by PCR NEGATIVE NEGATIVE Final   Influenza B by PCR NEGATIVE NEGATIVE Final    Comment: (NOTE) The Xpert Xpress SARS-CoV-2/FLU/RSV plus assay is intended as an aid in the diagnosis of influenza from Nasopharyngeal swab specimens and should not be used as a sole basis for treatment. Nasal washings and aspirates are unacceptable for Xpert Xpress SARS-CoV-2/FLU/RSV testing.  Fact Sheet for Patients: EntrepreneurPulse.com.au  Fact Sheet for Healthcare Providers: IncredibleEmployment.be  This test is not yet approved or cleared by the [place] FDA and has been authorized for detection and/or diagnosis of SARS-CoV-2 by FDA under an Emergency Use Authorization (EUA). This EUA will remain in effect (meaning this test can be used) for the duration of the COVID-19 declaration under Section 564(b)(1) of the Act, 21 U.S.C. section 360bbb-3(b)(1), unless the authorization is terminated or revoked.  Performed at [facility], [place] [address] [place], [place] [address]   Blood Culture (routine x 2)     Status: None (Preliminary result)   Collection Time: 09/20/20 10:07 PM   Specimen: BLOOD  Result Value Ref Range Status   Specimen Description BLOOD LEFT ANTECUBITAL  Final   Special Requests   Final    BOTTLES DRAWN AEROBIC AND ANAEROBIC Blood Culture adequate volume   Culture   Final    NO GROWTH 2 DAYS Performed at [facility], [place] [address], [place], [place] [address]    Report Status PENDING  Incomplete    Studies/Results: CT ABDOMEN PELVIS WO CONTRAST  Result Date: 09/21/2020 CLINICAL DATA:  40 year old female 3 weeks status post hysterectomy with pain an abnormal fluid collection concerning for possible urinoma versus abscess or hematoma. She presents for planned CT-guided aspiration/drain placement. Due to findings on the planning  CT scan of the pelvis, the procedural portion of the exam was canceled and a dedicated CT scan of the abdomen and pelvis was performed instead. EXAM: CT ABDOMEN AND PELVIS WITHOUT CONTRAST TECHNIQUE: Multidetector CT imaging of the abdomen and pelvis was performed following the standard protocol without IV contrast. COMPARISON:  CT abdomen/pelvis obtained yesterday 09/20/2020 FINDINGS: Hepatobiliary: No acute abnormality Pancreas: No acute abnormality Spleen: No acute abnormality Adrenals/Urinary Tract: Near-total resolution of right-sided hydronephrosis. There is minimal fullness of the renal pelvis and lower pole calices. Persistent, and perhaps slightly increased left-sided hydronephrosis. The left ureter is also dilated and contains contrast. There is a suggestion of focal extravasation of contrast from the distal ureter in the anatomic pelvis (axial image 76 of series 3). The previously identified peripherally enhancing fluid collection has significantly reduced in size and volume since the recent prior study. However, the fluid is now high in attenuation consistent with the presence of excreted contrast material. Stomach/Bowel: Stomach is within normal limits. Appendix appears normal. No evidence of bowel wall thickening, distention, or inflammatory changes. Vascular/Lymphatic: Limited evaluation in the absence of intravenous contrast. No aneurysm,  significant atherosclerotic plaque or suspicious lymphadenopathy. Reproductive: Surgical changes of hysterectomy. Other: No abdominal wall hernia or abnormality. No abdominopelvic ascites. Musculoskeletal: No acute or significant osseous findings. IMPRESSION: CT findings demonstrate diminished irregular fluid collection in the anatomic pelvis which now contains excreted contrast material. The imaging findings confirm the presence of a urinary leak. The diminished volume of the fluid collection suggests that the urinoma is decompressing through the vaginal cuff.  After discussing with the patient, she confirms that she is having urine like vaginal discharge. Suspected site of urinary leak is the distal left ureter. Improving right-sided hydronephrosis, persistent and perhaps slightly worsened left-sided hydronephrosis. Electronically Signed   By: Jacqulynn Cadet M.D.   On: 09/21/2020 14:09   CT ABDOMEN PELVIS W CONTRAST  Result Date: 09/20/2020 CLINICAL DATA:  Abdomen pain and possible urine leakage following hysterectomy pink fluid from vagina EXAM: CT ABDOMEN AND PELVIS WITH CONTRAST TECHNIQUE: Multidetector CT imaging of the abdomen and pelvis was performed using the standard protocol following bolus administration of intravenous contrast. CONTRAST:  158mL OMNIPAQUE IOHEXOL 300 MG/ML  SOLN COMPARISON:  None. FINDINGS: Lower chest: Lung bases demonstrate no acute consolidation or effusion. Hepatobiliary: No focal liver abnormality is seen. No gallstones, gallbladder wall thickening, or biliary dilatation. Pancreas: Unremarkable. No pancreatic ductal dilatation or surrounding inflammatory changes. Spleen: Normal in size without focal abnormality. Adrenals/Urinary Tract: Adrenal glands are normal. Moderate left and mild to moderate right hydronephrosis. There is left hydroureter. Ureter can be followed to the level of the mid pelvis and then becomes obscured by fluid collection within the pelvis, series 4, image number 72. Urinary bladder is unremarkable. The right ureter cannot be completely follow distally as well. There is delayed excretion of contrast within the left kidney consistent with decreased renal function. Stomach/Bowel: The stomach is nonenlarged no dilated small bowel. Mild thickened small bowel loops within the pelvis. Vascular/Lymphatic: Nonaneurysmal aorta.  No suspicious nodes Reproductive: Status post hysterectomy.  No adnexal mass. Other: No free air. Small amount of free fluid adjacent to the liver and spleen as well as within the bilateral lower  quadrants and pelvis. Mildly rim enhancing collection superior to the bladder measuring approximately 8.5 x 5.2 cm, it is contiguous with irregular fluid in the posterior pelvis and the fluid in the vicinity of the of distal left ureter. Fluid collection may be contiguous with the vaginal cough. Musculoskeletal: No acute or significant osseous findings. IMPRESSION: 1. 8.5 cm mildly rim enhancing fluid collection within the pelvis. Associated findings of left greater than right hydronephrosis with decreased left renal function on delayed imaging as well as the presence of small amount of abdominopelvic ascites raise concern for possible ureteral injury and urinoma. Other consideration for the pelvic fluid collection could include hematoma or postoperative collection, there is some suggestion that the fluid collection may be contiguous with the vaginal cuff. No internal gas within the fluid collection as could be seen with abscess. 2. Thickened small bowel loops within the pelvis are likely reactive. Electronically Signed   By: Donavan Foil M.D.   On: 09/20/2020 18:09   DG Retrograde Pyelogram  Result Date: 09/22/2020 CLINICAL DATA:  Status post hysterectomy with imaging evidence of distal left ureteral injury with contrast leak and urinoma. EXAM: INTRAOPERATIVE BILATERAL RETROGRADE UROGRAPHY TECHNIQUE: Images were obtained with the C-arm fluoroscopic device intraoperatively and submitted for interpretation post-operatively. Please see the procedural report for the amount of contrast and the fluoroscopy time utilized. COMPARISON:  CT of the abdomen and pelvis earlier  today. FINDINGS: Intraoperative imaging with a C-arm demonstrates intact right ureter after retrograde injection with some degree of right-sided hydronephrosis. Left-sided retrograde ureteral injection demonstrates disruption of the distal left ureter in the pelvis roughly 4-6 cm distal to the UVJ with extravasation of contrast. IMPRESSION: 1. Intact  right ureter with some degree of right-sided hydronephrosis present. 2. Transected distal left ureter with contrast extravasation in the pelvis with retrograde injection. Electronically Signed   By: Aletta Edouard M.D.   On: 09/22/2020 08:27    Assessment/Plan S/p left ureteral reimplantation after recent left ureteral injury at time of hysterectomy 09/04/20   Plan to follow drain/foley output. Advance diet per Gyn as tolerated.   LOS: 1 day   Remi Haggard 09/22/2020, 10:50 AM

## 2020-09-23 MED ORDER — CHLORHEXIDINE GLUCONATE CLOTH 2 % EX PADS
6.0000 | MEDICATED_PAD | Freq: Every day | CUTANEOUS | Status: DC
  Administered 2020-09-23 – 2020-09-25 (×3): 6 via TOPICAL

## 2020-09-23 NOTE — Progress Notes (Addendum)
Notified Dr Earlene Plater pt reports location of abdominal pain not the same as before. Pt reports pain now across lower abdomin and not across upper abdomin. Pain described as spasm like cramps consistent with pt ongoing pain.  Abdomen assessment soft obese tender and without drainage consistent with earlier assessment. Pt appears calm and in no distress.

## 2020-09-23 NOTE — Progress Notes (Signed)
2 Days Post-Op Procedure(s) (LRB): CYSTOSCOPY WITH BILATERAL RETROGRADE LEFT URETERAL STENT PLACEMENT (Bilateral) OPEN URETERAL REIMPLANT (Bilateral)  Subjective: Patient reports incisional pain and tolerating PO.    Objective: I have reviewed patient's vital signs, intake and output, medications and microbiology. Blood pressure (!) 135/105, pulse 99, temperature 98.8 F (37.1 C), temperature source Oral, resp. rate 18, height 5\' 9"  (1.753 m), weight 89.4 kg, last menstrual period 08/31/2020, SpO2 94 %.  General: alert, cooperative and no distress Resp: effort normal Cardio: normal rate GI: incision: clean, dry and intact  Assessment: s/p Procedure(s) with comments: CYSTOSCOPY WITH BILATERAL RETROGRADE LEFT URETERAL STENT PLACEMENT (Bilateral) OPEN URETERAL REIMPLANT (Bilateral) - Abdominal Approach: progressing well and tolerating diet  Plan: Advance diet Encourage ambulation  LOS: 2 days    14/06/2020 09/23/2020, 6:51 AM

## 2020-09-23 NOTE — Progress Notes (Signed)
Faculty Note  Alerted by RN to new patient pain.  Patient reports new onset lower abdominal pain, reports it 10/10, as stabbing and throbbing when it happens, all across lower abdomen. Started after last PO pain meds and she is not sure if that has helped.  Gen: alert, oriented, resting comfortably Abd: soft, moderately tender, incision clear/dry/intact  A/P: 40 yo POD#2 s/p open ureteral reimplant, recovering appropriately with new onset lower abdominal pain, suspect bladder spasm. Cont PO pain meds and advance diet.  Appreciate Urology care.   Baldemar Lenis, MD, [facility] Attending Center for [employer] [facility])

## 2020-09-23 NOTE — Progress Notes (Signed)
2 Days Post-Op Subjective: Patient reports continued incisional pain.Tolerating po's so far.  No bowel movement yet.  Urine is clear, JP output 363.  Objective: Vital signs in last 24 hours: Temp:  [98.4 F (36.9 C)-98.9 F (37.2 C)] 98.8 F (37.1 C) (01/02 0541) Pulse Rate:  [92-99] 99 (01/02 0541) Resp:  [16-18] 18 (01/02 0541) BP: (127-136)/(91-105) 135/105 (01/02 0541) SpO2:  [94 %-98 %] 94 % (01/02 0541)  Intake/Output from previous day: 01/01 0701 - 01/02 0700 In: 1005 [P.O.:480; I.V.:525] Out: 1463 [Urine:1100; Drains:363] Intake/Output this shift: No intake/output data recorded.  Physical Exam:  General:alert and cooperative [id number] looks good,clean and dry positive bowel sounds Female genitalia: not done   Lab Results: Recent Labs    09/21/20 0402 09/21/20 2153 09/22/20 0326  HGB 9.7* 9.9* 9.1*  HCT 31.9* 32.4* 30.0*   BMET Recent Labs    09/21/20 0402 09/21/20 2153 09/22/20 0326  NA 137  --  137  K 4.4  --  4.3  CL 102  --  100  CO2 23  --  24  GLUCOSE 102*  --  150*  BUN 7  --  5*  CREATININE 1.13* 0.98 0.84  CALCIUM 9.3  --  8.9   Recent Labs    09/20/20 1546  INR 1.1   No results for input(s): LABURIN in the last 72 hours. Results for orders placed or performed during the hospital encounter of 09/19/20  Blood Culture (routine x 2)     Status: None (Preliminary result)   Collection Time: 09/20/20  3:46 PM   Specimen: BLOOD  Result Value Ref Range Status   Specimen Description BLOOD RIGHT ANTECUBITAL  Final   Special Requests   Final    BOTTLES DRAWN AEROBIC AND ANAEROBIC Blood Culture results may not be optimal due to an inadequate volume of blood received in culture bottles   Culture   Final    NO GROWTH 2 DAYS Performed at [place] [address] [place], [place] [address]    Report Status PENDING  Incomplete  Urine culture     Status: Abnormal   Collection Time: 09/20/20  8:56 PM   Specimen: In/Out Cath Urine   Result Value Ref Range Status   Specimen Description IN/OUT CATH URINE  Final   Special Requests   Final    NONE Performed at [facility], [place] [address] [place], [place] [address]    Culture MULTIPLE SPECIES PRESENT, SUGGEST RECOLLECTION (A)  Final   Report Status 09/22/2020 FINAL  Final  Resp Panel by RT-PCR (Flu A&B, Covid) Nasopharyngeal Swab     Status: None   Collection Time: 09/20/20  9:30 PM   Specimen: Nasopharyngeal Swab; Nasopharyngeal(NP) swabs in vial transport medium  Result Value Ref Range Status   SARS Coronavirus 2 by RT PCR NEGATIVE NEGATIVE Final    Comment: (NOTE) SARS-CoV-2 target nucleic acids are NOT DETECTED.  The SARS-CoV-2 RNA is generally detectable in upper respiratory specimens during the acute phase of infection. The lowest concentration of SARS-CoV-2 viral copies this assay can detect is 138 copies/mL. A negative result does not preclude SARS-Cov-2 infection and should not be used as the sole basis for treatment or other patient management decisions. A negative result may occur with  improper specimen collection/handling, submission of specimen other than nasopharyngeal swab, presence of viral mutation(s) within the areas targeted by this assay, and inadequate number of viral copies(<138 copies/mL). A negative result must be combined with clinical  observations, patient history, and epidemiological information. The expected result is Negative.  Fact Sheet for Patients:  BloggerCourse.com  Fact Sheet for Healthcare Providers:  SeriousBroker.it  This test is no t yet approved or cleared by the [place] FDA and  has been authorized for detection and/or diagnosis of SARS-CoV-2 by FDA under an Emergency Use Authorization (EUA). This EUA will remain  in effect (meaning this test can be used) for the duration of the COVID-19 declaration under Section 564(b)(1) of the Act, 21 U.S.C.section  360bbb-3(b)(1), unless the authorization is terminated  or revoked sooner.       Influenza A by PCR NEGATIVE NEGATIVE Final   Influenza B by PCR NEGATIVE NEGATIVE Final    Comment: (NOTE) The Xpert Xpress SARS-CoV-2/FLU/RSV plus assay is intended as an aid in the diagnosis of influenza from Nasopharyngeal swab specimens and should not be used as a sole basis for treatment. Nasal washings and aspirates are unacceptable for Xpert Xpress SARS-CoV-2/FLU/RSV testing.  Fact Sheet for Patients: BloggerCourse.com  Fact Sheet for Healthcare Providers: SeriousBroker.it  This test is not yet approved or cleared by the [place] FDA and has been authorized for detection and/or diagnosis of SARS-CoV-2 by FDA under an Emergency Use Authorization (EUA). This EUA will remain in effect (meaning this test can be used) for the duration of the COVID-19 declaration under Section 564(b)(1) of the Act, 21 U.S.C. section 360bbb-3(b)(1), unless the authorization is terminated or revoked.  Performed at [facility], [address], [place], [place] [address]   Blood Culture (routine x 2)     Status: None (Preliminary result)   Collection Time: 09/20/20 10:07 PM   Specimen: BLOOD  Result Value Ref Range Status   Specimen Description BLOOD LEFT ANTECUBITAL  Final   Special Requests   Final    BOTTLES DRAWN AEROBIC AND ANAEROBIC Blood Culture adequate volume   Culture   Final    NO GROWTH 2 DAYS Performed at [facility], [address] [place], [place] [address]    Report Status PENDING  Incomplete    Studies/Results: CT ABDOMEN PELVIS WO CONTRAST  Result Date: 09/21/2020 CLINICAL DATA:  40 year old female 3 weeks status post hysterectomy with pain an abnormal fluid collection concerning for possible urinoma versus abscess or hematoma. She presents for planned CT-guided aspiration/drain placement. Due to findings on the planning  CT scan of the pelvis, the procedural portion of the exam was canceled and a dedicated CT scan of the abdomen and pelvis was performed instead. EXAM: CT ABDOMEN AND PELVIS WITHOUT CONTRAST TECHNIQUE: Multidetector CT imaging of the abdomen and pelvis was performed following the standard protocol without IV contrast. COMPARISON:  CT abdomen/pelvis obtained yesterday 09/20/2020 FINDINGS: Hepatobiliary: No acute abnormality Pancreas: No acute abnormality Spleen: No acute abnormality Adrenals/Urinary Tract: Near-total resolution of right-sided hydronephrosis. There is minimal fullness of the renal pelvis and lower pole calices. Persistent, and perhaps slightly increased left-sided hydronephrosis. The left ureter is also dilated and contains contrast. There is a suggestion of focal extravasation of contrast from the distal ureter in the anatomic pelvis (axial image 76 of series 3). The previously identified peripherally enhancing fluid collection has significantly reduced in size and volume since the recent prior study. However, the fluid is now high in attenuation consistent with the presence of excreted contrast material. Stomach/Bowel: Stomach is within normal limits. Appendix appears normal. No evidence of bowel wall thickening, distention, or inflammatory changes. Vascular/Lymphatic: Limited evaluation in the absence of intravenous contrast.  No aneurysm, significant atherosclerotic plaque or suspicious lymphadenopathy. Reproductive: Surgical changes of hysterectomy. Other: No abdominal wall hernia or abnormality. No abdominopelvic ascites. Musculoskeletal: No acute or significant osseous findings. IMPRESSION: CT findings demonstrate diminished irregular fluid collection in the anatomic pelvis which now contains excreted contrast material. The imaging findings confirm the presence of a urinary leak. The diminished volume of the fluid collection suggests that the urinoma is decompressing through the vaginal cuff.  After discussing with the patient, she confirms that she is having urine like vaginal discharge. Suspected site of urinary leak is the distal left ureter. Improving right-sided hydronephrosis, persistent and perhaps slightly worsened left-sided hydronephrosis. Electronically Signed   By: Jacqulynn Cadet M.D.   On: 09/21/2020 14:09   DG Retrograde Pyelogram  Result Date: 09/22/2020 CLINICAL DATA:  Status post hysterectomy with imaging evidence of distal left ureteral injury with contrast leak and urinoma. EXAM: INTRAOPERATIVE BILATERAL RETROGRADE UROGRAPHY TECHNIQUE: Images were obtained with the C-arm fluoroscopic device intraoperatively and submitted for interpretation post-operatively. Please see the procedural report for the amount of contrast and the fluoroscopy time utilized. COMPARISON:  CT of the abdomen and pelvis earlier today. FINDINGS: Intraoperative imaging with a C-arm demonstrates intact right ureter after retrograde injection with some degree of right-sided hydronephrosis. Left-sided retrograde ureteral injection demonstrates disruption of the distal left ureter in the pelvis roughly 4-6 cm distal to the UVJ with extravasation of contrast. IMPRESSION: 1. Intact right ureter with some degree of right-sided hydronephrosis present. 2. Transected distal left ureter with contrast extravasation in the pelvis with retrograde injection. Electronically Signed   By: Aletta Edouard M.D.   On: 09/22/2020 08:27    Assessment/Plan: S/p left ureteral reimplantation after recent left ureteral injury at time of hysterectomy 09/04/20   Plan to follow drain/foley output. Ambulate.Kelvin Cellar diet as tolerated      LOS: 2 days   Remi Haggard 09/23/2020, 7:42 AM

## 2020-09-24 ENCOUNTER — Encounter: Payer: BLUE CROSS/BLUE SHIELD | Admitting: Obstetrics and Gynecology

## 2020-09-24 DIAGNOSIS — Z9889 Other specified postprocedural states: Secondary | ICD-10-CM

## 2020-09-24 DIAGNOSIS — G8918 Other acute postprocedural pain: Secondary | ICD-10-CM

## 2020-09-24 DIAGNOSIS — N368 Other specified disorders of urethra: Secondary | ICD-10-CM | POA: Diagnosis not present

## 2020-09-24 LAB — CREATININE, FLUID (PLEURAL, PERITONEAL, JP DRAINAGE): Creat, Fluid: 0.9 mg/dL

## 2020-09-24 LAB — GLUCOSE, CAPILLARY: Glucose-Capillary: 136 mg/dL — ABNORMAL HIGH (ref 70–99)

## 2020-09-24 MED ORDER — SENNOSIDES-DOCUSATE SODIUM 8.6-50 MG PO TABS
1.0000 | ORAL_TABLET | Freq: Two times a day (BID) | ORAL | Status: AC
  Administered 2020-09-24 – 2020-09-25 (×4): 1 via ORAL
  Filled 2020-09-24 (×5): qty 1

## 2020-09-24 MED ORDER — IBUPROFEN 400 MG PO TABS
600.0000 mg | ORAL_TABLET | Freq: Four times a day (QID) | ORAL | Status: DC
  Administered 2020-09-24 – 2020-09-26 (×7): 600 mg via ORAL
  Filled 2020-09-24 (×7): qty 1

## 2020-09-24 NOTE — Progress Notes (Signed)
3 Days Post-Op   Subjective/Chief Complaint:  1 - Left Ureteral Injury - s/p open left ureteral reimplant 09/21/20 in combined surgery with Dr. Jolayne Panther (GYN) and Journei Thomassen (Urol). JP and foley placed. JP Cr same as serum (no leak) 1/3 and removed.   Today "Tracy [name] " is improving. Some less clinical ileus, now tollerating PO intake and return of flatus. Ambulating some too. Resolved vaginal fluid discharge.    Objective: Vital signs in last 24 hours: Temp:  [98.2 F (36.8 C)-98.6 F (37 C)] 98.5 F (36.9 C) (01/03 0525) Pulse Rate:  [74-93] 86 (01/03 0525) Resp:  [16-18] 16 (01/03 0525) BP: (125-141)/(87-97) 141/97 (01/03 0525) SpO2:  [96 %-100 %] 98 % (01/03 0525) Last BM Date: 09/17/20  Intake/Output from previous day: 01/02 0701 - 01/03 0700 In: 2463.5 [I.V.:2463.5] Out: 1450 [Urine:1350; Drains:100] Intake/Output this shift: Total I/O In: -  Out: 460 [Urine:400; Drains:60]  General appearance: alert, cooperative and in good spirits Eyes: negative Nose: Nares normal. Septum midline. Mucosa normal. No drainage or sinus tenderness. Throat: lips, mucosa, and tongue normal; teeth and gums normal Neck: supple, symmetrical, trachea midline Back: symmetric, no curvature. ROM normal. No CVA tenderness. Resp: Non-labored on minimal NCO2 Cardio: Nl rate GI: soft, non-tender; bowel sounds normal; no masses,  no organomegaly and recent midline incision c/d/i. Non-distended. JP with non-foul serous fluid. Removed and dry dressing placed.  Pelvic: external genitalia normal Extremities: extremities normal, atraumatic, no cyanosis or edema Lymph nodes: Cervical, supraclavicular, and axillary nodes normal. Neurologic: Grossly normal  Lab Results:  Recent Labs    09/21/20 2153 09/22/20 0326  WBC 12.4* 10.8*  HGB 9.9* 9.1*  HCT 32.4* 30.0*  PLT 489* 484*   BMET Recent Labs    09/21/20 2153 09/22/20 0326  NA  --  137  K  --  4.3  CL  --  100  CO2  --  24  GLUCOSE  --  150*   BUN  --  5*  CREATININE 0.98 0.84  CALCIUM  --  8.9   PT/INR No results for input(s): LABPROT, INR in the last 72 hours. ABG No results for input(s): PHART, HCO3 in the last 72 hours.  Invalid input(s): PCO2, PO2  Studies/Results: No results found.  Anti-infectives: Anti-infectives (From admission, onward)   Start     Dose/Rate Route Frequency Ordered Stop   09/21/20 0000  ampicillin-sulbactam (UNASYN) 1.5 g in sodium chloride 0.9 % 100 mL IVPB  Status:  Discontinued        1.5 g 200 mL/hr over 30 Minutes Intravenous Every 6 hours 09/20/20 2134 09/21/20 1915   09/20/20 1715  cefTRIAXone (ROCEPHIN) 1 g in sodium chloride 0.9 % 100 mL IVPB        1 g 200 mL/hr over 30 Minutes Intravenous  Once 09/20/20 1713 09/20/20 1836      Assessment/Plan:  1 - Left Ureteral Injury - doing well POD 3. JP drain now out.   Keep current foley. Will need cystogram abtou 14 days post-op before removal.   OK for DC home from Urol perspective when toll reg diet, ambulatory, pain managable on PO meds.   Sebastian Ache 09/24/2020

## 2020-09-24 NOTE — Op Note (Signed)
NAME: [name], [name] MEDICAL RECORD [id number] ACCOUNT [id number] DATE OF BIRTH:1980-10-22 FACILITY: [facility] LOCATION: [facility] PHYSICIAN:Jendaya Gossett, MD  OPERATIVE REPORT  DATE OF PROCEDURE:  09/21/2020  PREOPERATIVE DIAGNOSIS:  Urinoma.  POSTOPERATIVE DIAGNOSIS:  Left ureteral injury.  PROCEDURE: 1.  Cystoscopy, bilateral retrograde pyelograms, interpretation. 2.  Left open ureteral reimplantation.  ESTIMATED BLOOD LOSS:  100 mL.  COMPLICATIONS:  None.  SPECIMENS:  None.  ASSISTANT:  Catalina Antigua, MD  FINDINGS: 1.  Unremarkable right retrograde pyelogram. 2.  Unremarkable urinary bladder. 3.  Distal left ureteral injury. 4.  Successful reimplantation of left ureter into dome of the bladder, prostate level 2 cm below the iliacs.  DRAINS: 1.  Jackson-Pratt drain was suctioned. 2.  Gastric drain.  INDICATIONS:  The patient is a pleasant 40 year old lady with recent history of open hysterectomy several weeks ago.  She was found on workup of abdominal pain to have a fluid collection in her abdomen.  Notably, her hemoglobin and creatinine are both  unstable.  Overall picture was somewhat concerning for possible bladder versus ureteral injury.  She underwent a subsequent study for drain placement.  However, during the study was noted to have contrast within her abdomen previously been in the  kidneys, thus confirming bladder or ureteral injury.  Options were discussed including recommended path of operative cystoscopy, retrogrades, possible ureteral implantation and she wished to proceed.  The patient's primary surgeon, Peggy Constant  graciously agreed to assist.  Informed consent was obtained and placed in medical record.  DESCRIPTION OF PROCEDURE:  The patient being verified, the procedure being cystoscopy, bilateral retrograde pyelograms, possible ureteral reimplantation was confirmed.  Procedure timeout was performed.  Intravenous antibiotics administered.  General   endotracheal anesthesia induced.  The patient was placed into a low lithotomy position.  Sterile field was created, prepped and draped the patient's vagina, introitus and proximal thighs using iodine and her infraxiphoid abdomen using chlorhexidine  gluconate after her arms were tucked at her side.  An LAVH type drape was deployed to allow access to her perineum and her abdomen.  Attention was directed to cystourethroscopy.  Cystourethroscopy was performed using a 21F_ rigid cystoscope with offset lens  lens.  Inspection of bladder revealed no diverticula, calcifications, papillary lesions.  There were no obvious cystotomies within the bladder.  The right ureteral orifice was cannulated with a 5-French open ended catheter and right retrograde pyelogram was  obtained.  Right retrograde pyelogram demonstrated a single right ureter, single system right kidney.  No filling defects or narrowing noted.  Similarly, left retrograde pyelogram was obtained.  Left retrograde pyelogram demonstrated a single left ureter with extravasation of contrast approximately 3-4 cm above the ureterovesical junction next to the operating site of urinoma.  Several attempts were made using first an angle-tipped Glidewire and  then a straight Glidewire to restore ureteral continuity, this was not successful.  As we discussed previously with the patient, we elected to proceed with primary ureteral reimplantation.  A Foley catheter was placed per urethra to straight drain.  The  patient's previous incision had been a Pfannenstiel type; however, I was concerned that this may not allow sufficient superior mobility of the ureter.  As such, a midline incision was made from the area of the pubis superiorly to distance of  approximately 2 cm above the umbilicus.  Dissection was carried down through subQ tissue to the level of fascia, which was coldly incised using Metz scissors, exposing the peritoneum, which was similarly coldly excised.  In peritoneal cavity, there was  non-foul simple fluid consistent with known urinoma.  This was suctioned free.  An Omni-Tract type retractor was then deployed.  Attention was directed at identification and inspection of the left retroperitoneum.  Incision was made lateral to the  descending colon from position approximately 6 cm above the iliacs distally, just lateral to the left medial umbilical ligament and this posterior peritoneal flap was mobilized medially.  The peritoneum was then dissected inferiorly towards the area of  the iliacs, which was positively identified as was the psoas musculature and psoas tendon.  This plane was somewhat edematous, inflamed from the urinoma, but the ureter was encountered somewhat dilated at this level and on the course of the iliacs,  circumferentially mobilized and tagged with a vessel loop, dissected distally to the true pelvis where it became lost in some adhesed tissue and then proximally for a distance of approximately 7 cm above the iliac crossing.  Distal aspect was clipped  with Hem-o-Lok clip and a stay suture of 4-0 Vicryl was applied and the ureter spatulated for a distance of approximately 6 mm and appeared to be of sufficient length for reimplantation and a suitable vascularity.  This was then set aside.  Attention was  then directed at further development of space of Retzius and bladder.  The bladder was filled with the in situ Foley catheter, approximately 250 mL, which better denoted the bladder.  The space of Retzius was carefully developed bluntly and left lateral  bladder attachments were taken down using combination of blunt dissection and Metzenbaum dissection, thus allowing more mobility of the dome of the bladder.  With this minimal bladder dissection, the geometry was quite favorable for primary  rehabilitation without any sort of hitching of the bladder.  As such, a small cystotomy was made in the dome of the bladder erring to the left side  and also approximately 6 mm and 2 mucosal everting sutures were placed at the 6 o'clock, 12 o'clock  positions respectively.  The previous heel stitch of the ureter was anchored and a 6 x 24 Contour stent was carefully placed proximally and distally over the anastomosis, the distal end being within the lumen of the urinary bladder.  Further  ureteral mucosa to bladder mucosa anastomosis was performed using 2 separate running suture lines of 4-0 Vicryl from the heel to the toe respectively, which revealed an excellent tension-free apposition.  I was quite happy with the vascularity, with the  geometry of the reimplant and of the caliber.  Retractor was taken down.  Sponge, needle counts were correct.  Omentum was brought over the incision.  A Blake type drain was placed via a small counter incision on the right side anterior of the abdominal  cavity, such that it lie purposely not on the area of bladder, ureter anastomosis.  The fascia was reapproximated using figure-of-eight PDS times approximately 10 followed by reapproximation of Scarpa's with running Vicryl, the skin was reapproximated  using 4-0 Monocryl in subcuticular fashion followed by Dermabond.  Drain stitch was applied.  Procedure was terminated.  The patient tolerated the procedure well.  No immediate complications.  The patient was taken to postanesthesia care unit in stable  condition with plan for continued medical surgical admission.  She will keep her drain for now and a Foley catheter for at least 10-14 days before cystogram and stent will be removed in a delayed fashion several weeks after this.  HN/NUANCE  D:09/21/2020 T:09/22/2020 JOB:[id number]

## 2020-09-24 NOTE — Progress Notes (Signed)
Subjective: Pt seen.  She is tolerating regular diet and passing gas.  Pt states she has not had a bowel movement since last Monday.  She has sat up in the chair but has not done a lot of ambulation.  Lower abdominal pain is no longer present. Objective: Vital signs in last 24 hours: Temp:  [98.2 F (36.8 C)-98.6 F (37 C)] 98.5 F (36.9 C) (01/03 0525) Pulse Rate:  [74-93] 86 (01/03 0525) Resp:  [16-18] 16 (01/03 0525) BP: (125-141)/(87-97) 141/97 (01/03 0525) SpO2:  [96 %-100 %] 98 % (01/03 0525) Weight change:   Intake/Output from previous day: 01/02 0701 - 01/03 0700 In: 2463.5 [I.V.:2463.5] Out: 1450 [Urine:1350; Drains:100] Intake/Output this shift: Total I/O In: -  Out: 460 [Urine:400; Drains:60]  General appearance: alert, cooperative and no distress Head: Normocephalic, without obvious abnormality, atraumatic Resp: clear to auscultation bilaterally Cardio: regular rate and rhythm GI: soft, non-tender; bowel sounds normal; no masses,  no organomegaly Skin: Skin color, texture, turgor normal. No rashes or lesions Incision/Wound: midline skin incision healing well.  Suprapubic drain present and draining serosanguinous fluid Lab Results: Recent Labs    09/21/20 2153 09/22/20 0326  WBC 12.4* 10.8*  HGB 9.9* 9.1*  HCT 32.4* 30.0*  PLT 489* 484*   BMET:  Recent Labs    09/21/20 2153 09/22/20 0326  NA  --  137  K  --  4.3  CL  --  100  CO2  --  24  GLUCOSE  --  150*  BUN  --  5*  CREATININE 0.98 0.84  CALCIUM  --  8.9     I have reviewed the patient's current medications.  Assessment/Plan: POD 3 s/p reoperation for reimplantation of left ureter Pt recovering well Advise increased ambulation. Appreciate urology assistance.  Will discuss about remaining time in the hospital.    LOS: 3 days   Warden Fillers 09/24/2020,11:51 AM

## 2020-09-25 ENCOUNTER — Inpatient Hospital Stay (HOSPITAL_COMMUNITY): Payer: Medicaid Other

## 2020-09-25 DIAGNOSIS — Z9889 Other specified postprocedural states: Secondary | ICD-10-CM | POA: Diagnosis not present

## 2020-09-25 DIAGNOSIS — N368 Other specified disorders of urethra: Secondary | ICD-10-CM | POA: Diagnosis not present

## 2020-09-25 DIAGNOSIS — G8918 Other acute postprocedural pain: Secondary | ICD-10-CM | POA: Diagnosis not present

## 2020-09-25 LAB — CULTURE, BLOOD (ROUTINE X 2)
Culture: NO GROWTH
Culture: NO GROWTH
Special Requests: ADEQUATE

## 2020-09-25 LAB — GLUCOSE, CAPILLARY: Glucose-Capillary: 96 mg/dL (ref 70–99)

## 2020-09-25 MED ORDER — AMLODIPINE BESYLATE 5 MG PO TABS
5.0000 mg | ORAL_TABLET | Freq: Every day | ORAL | Status: DC
  Administered 2020-09-25 – 2020-09-26 (×2): 5 mg via ORAL
  Filled 2020-09-25 (×2): qty 1

## 2020-09-25 MED ORDER — IOHEXOL 300 MG/ML  SOLN
100.0000 mL | Freq: Once | INTRAMUSCULAR | Status: AC | PRN
  Administered 2020-09-25: 100 mL via INTRAVENOUS

## 2020-09-25 NOTE — Progress Notes (Signed)
Subjective: Pt seen.doing well.  She still notes occasional lower abdominal pain.  Pt also concerned about sedation when she takes her pain medication.  She is tolerating regular diet and pain is otherwise well controlled.  She notes continued flatus, but has not had a bowel movement. Objective: Vital signs in last 24 hours: Temp:  [97.6 F (36.4 C)-98.7 F (37.1 C)] 97.8 F (36.6 C) (01/04 1204) Pulse Rate:  [83-92] 92 (01/04 1204) Resp:  [16-18] 18 (01/04 1204) BP: (135-141)/(88-99) 140/99 (01/04 1204) SpO2:  [91 %-99 %] 99 % (01/04 1204) Weight change:    General appearance: alert, cooperative and no distress Head: Normocephalic, without obvious abnormality, atraumatic Resp: clear to auscultation bilaterally Cardio: regular rate and rhythm Extremities: extremities normal, atraumatic, no cyanosis or edema and Homans sign is negative, no sign of DVT Incision/Wound: midline incision clean dry and intact   I have reviewed the patient's current medications.  Assessment/Plan: POD 4 s/p reoperation for reimplantation of left ureter Pt has been cleared by urology Discussed discharge today, pt still concerned about pain so CT scan ordered for further evaluation. If normal will proceed with discharge.     LOS: 4 days   Warden Fillers 09/25/2020,2:43 PM

## 2020-09-25 NOTE — Progress Notes (Signed)
Pt blood pressure running high she stated that she takes BP medication at home. RN called [facility] to try and get a hold of Dr. Donavan Foil and got a hlod of one of the physcians int he practice who stated she would notify the night Physician that was taking over so they can get her BP situated.

## 2020-09-26 ENCOUNTER — Other Ambulatory Visit (HOSPITAL_COMMUNITY): Payer: Self-pay | Admitting: Obstetrics and Gynecology

## 2020-09-26 DIAGNOSIS — G8918 Other acute postprocedural pain: Secondary | ICD-10-CM | POA: Diagnosis not present

## 2020-09-26 DIAGNOSIS — N368 Other specified disorders of urethra: Secondary | ICD-10-CM | POA: Diagnosis not present

## 2020-09-26 DIAGNOSIS — Z9889 Other specified postprocedural states: Secondary | ICD-10-CM | POA: Diagnosis not present

## 2020-09-26 LAB — AEROBIC CULTURE

## 2020-09-26 MED ORDER — AMLODIPINE BESYLATE 5 MG PO TABS: 5.0000 mg | ORAL_TABLET | Freq: Every day | ORAL | 2 refills | Status: DC

## 2020-09-26 MED ORDER — OXYCODONE-ACETAMINOPHEN 5-325 MG PO TABS
1.0000 | ORAL_TABLET | ORAL | 0 refills | Status: DC | PRN
Start: 2020-09-26 — End: 2020-09-26

## 2020-09-26 MED ORDER — IBUPROFEN 600 MG PO TABS: 600.0000 mg | ORAL_TABLET | Freq: Four times a day (QID) | ORAL | 2 refills | Status: DC | PRN

## 2020-09-26 MED ORDER — GABAPENTIN 100 MG PO CAPS: 100.0000 mg | ORAL_CAPSULE | Freq: Three times a day (TID) | ORAL | 0 refills | Status: DC

## 2020-09-26 NOTE — Progress Notes (Signed)
This rn provided discharge instructions to pt. Provided education on application of leg bag, maintaining sterility and draining leg bag bag and maintaining proper urine flow of foley catheter, nstructed pt to notify urology office for any problems she may encounter with catheter or leg bag. Cleaning supplies for peri care and extra leg bag given to pt. Pt home meds ( 8 tabs ibuprofen 600 mg and 5 tabs oxycodone 5-325 mg) stored in pharmacy returned to pt. Pt tearful and expressing fear regarding inability to self maintain d/t loss of job and now no income as a result of recent and current hospitalization.  This rn called and left message withj emergency assistance  division at [employer] regarding pt urgent needs. Pt instructed to follow up with [place] [employer] emergency service department regarding rental and utility assistance.

## 2020-09-26 NOTE — Discharge Summary (Signed)
Physician Discharge Summary  Patient ID: Tracy [name] MRN: [id number] DOB/AGE: October 06, 1980 40 y.o.  Admit date: 09/19/2020 Discharge date: 09/26/2020  Admission Diagnoses: Abdominal pain                                         Postoperative complication                                         Urinoma                                            Discharge Diagnoses:  Active Problems:   Post-operative pain   Abdominal pain   Discharged Condition: good  Hospital Course: The patient was admitted from the ER s/p hysterectomy 09/04/20.  The patient had leakage of urine from the genital region.  CT scan showed fluid collection suspicious for urinoma.  Pt had CT guided drainage of fluid collection.  Urinoma was diagnosed.  Urology was consulted and decision was made for operative repair.  Please see operative note.  Pt had ureteral repair with placement of JP drain and foley catheter.  Postoperatively the patient did well.  She did have an episode of lower abdominal pain which eventually did away, but CT scan was ordered at patient's request.  No concerning changes were noted.  Pt was discharged home on POD 6.  She is aware of her follow up appointments.  Consults: urology  Significant Diagnostic Studies: labs: lactic acid, BMP,CBC microbiology: blood culture: negative and urine culture: multiple species and radiology: CT scan: urinoma and extravasation from left ureter  Treatments: surgery: reimplantation of left ureter  Discharge Exam: Blood pressure (!) 126/94, pulse 93, temperature 98.2 F (36.8 C), temperature source Oral, resp. rate 18, height 5\' 9"  (1.753 m), weight 89.4 kg, last menstrual period 08/31/2020, SpO2 100 %. General appearance: alert, cooperative and no distress Head: Normocephalic, without obvious abnormality, atraumatic Resp: clear to auscultation bilaterally Cardio: regular rate and rhythm GI: soft, non-tender; bowel sounds normal; no masses,  no  organomegaly Incision/Wound:  Disposition:  There are no questions and answers to display.         Allergies as of 09/26/2020   No Known Allergies     Medication List    STOP taking these medications   amoxicillin-clavulanate 875-125 MG tablet Commonly known as: AUGMENTIN   docusate sodium 100 MG capsule Commonly known as: COLACE   metroNIDAZOLE 500 MG tablet Commonly known as: FLAGYL     TAKE these medications   amLODipine 5 MG tablet Commonly known as: NORVASC Take 1 tablet (5 mg total) by mouth daily.   gabapentin 100 MG capsule Commonly known as: NEURONTIN Take 1 capsule (100 mg total) by mouth 3 (three) times daily for 7 days.   ibuprofen 600 MG tablet Commonly known as: ADVIL Take 1 tablet (600 mg total) by mouth every 6 (six) hours as needed for moderate pain. What changed: reasons to take this   oxyCODONE-acetaminophen 5-325 MG tablet Commonly known as: PERCOCET/ROXICET Take 1-2 tablets by mouth every 4 (four) hours as needed for up to 5 days for severe pain (for pain). What changed:   how much to take  when to take this  reasons to take this  Another medication with the same name was removed. Continue taking this medication, and follow the directions you see here.       Follow-up Information    Sebastian Ache, MD.   Specialty: Urology Why: Office will call to arrange office visit for bladder X-Ray and catheter removal in about 2 weeks.  Contact information: [address] [place] [address] [phone]        Adam Phenix, MD. Schedule an appointment as soon as possible for a visit in 1 week(s).   Specialty: Obstetrics and Gynecology Why: postoperative appointment in 1-2 weeks Contact information: [address] [place] [address] [phone]               Signed: Warden Fillers 09/26/2020, 11:50 AM

## 2020-09-26 NOTE — TOC Progression Note (Signed)
Transition of Care [facility]) - Progression Note    Patient Details  Name: Tracy [name] MRN: [id number] Date of Birth: 14-Jul-1981  [facility] CM/SW Contact  Nadene Rubins Adria Devon, RN Phone Number: 09/26/2020, 12:56 PM  Clinical Narrative:     Spoke to patient via phone. Nurse consulted NCM for no insurance and medication assistance.   NCM confirmed with patient she has [place] Medicaid and PCP through Medicaid. TOC pharmacy will fill medications and bring them to room prior to discharge.  Patient discharging with foley catheter , NCM asked nurse to provide teaching and extra supplies to patient. Patient aware.   Patient voiced understanding   Expected Discharge Plan: Home/Self Care    Expected Discharge Plan and Services Expected Discharge Plan: Home/Self Care   Discharge Planning Services: CM Consult   Living arrangements for the past 2 months: Single Family Home Expected Discharge Date: 09/26/20                 DME Agency: NA       HH Arranged: NA           Social Determinants of Health (SDOH) Interventions    Readmission Risk Interventions No flowsheet data found.

## 2020-09-27 ENCOUNTER — Other Ambulatory Visit: Payer: Self-pay | Admitting: Obstetrics & Gynecology

## 2020-10-04 ENCOUNTER — Encounter: Payer: Medicaid Other | Admitting: Obstetrics and Gynecology

## 2020-10-11 ENCOUNTER — Encounter: Payer: Self-pay | Admitting: Obstetrics and Gynecology

## 2020-10-17 ENCOUNTER — Other Ambulatory Visit: Payer: Self-pay | Admitting: Obstetrics & Gynecology

## 2020-11-13 ENCOUNTER — Encounter: Payer: Medicaid Other | Admitting: Obstetrics & Gynecology

## 2020-11-20 ENCOUNTER — Encounter: Payer: Medicaid Other | Admitting: Obstetrics & Gynecology

## 2021-01-28 ENCOUNTER — Encounter (HOSPITAL_COMMUNITY): Payer: Self-pay

## 2021-01-28 ENCOUNTER — Emergency Department (HOSPITAL_COMMUNITY)
Admission: EM | Admit: 2021-01-28 | Discharge: 2021-01-28 | Disposition: A | Discharge status: Home / Self Care | Payer: Medicaid Other | Attending: Emergency Medicine | Admitting: Emergency Medicine

## 2021-01-28 ENCOUNTER — Other Ambulatory Visit: Payer: Self-pay

## 2021-01-28 ENCOUNTER — Emergency Department (HOSPITAL_COMMUNITY): Payer: Medicaid Other

## 2021-01-28 DIAGNOSIS — Z87891 Personal history of nicotine dependence: Secondary | ICD-10-CM | POA: Diagnosis not present

## 2021-01-28 DIAGNOSIS — I10 Essential (primary) hypertension: Secondary | ICD-10-CM | POA: Insufficient documentation

## 2021-01-28 DIAGNOSIS — K429 Umbilical hernia without obstruction or gangrene: Secondary | ICD-10-CM

## 2021-01-28 DIAGNOSIS — Z79899 Other long term (current) drug therapy: Secondary | ICD-10-CM | POA: Insufficient documentation

## 2021-01-28 DIAGNOSIS — R1033 Periumbilical pain: Secondary | ICD-10-CM | POA: Diagnosis present

## 2021-01-28 LAB — CBC WITH DIFFERENTIAL/PLATELET
Abs Immature Granulocytes: 0.02 10*3/uL (ref 0.00–0.07)
Basophils Absolute: 0 10*3/uL (ref 0.0–0.1)
Basophils Relative: 0 %
Eosinophils Absolute: 0.1 10*3/uL (ref 0.0–0.5)
Eosinophils Relative: 1 %
HCT: 36.6 % (ref 36.0–46.0)
Hemoglobin: 11.5 g/dL — ABNORMAL LOW (ref 12.0–15.0)
Immature Granulocytes: 0 %
Lymphocytes Relative: 22 %
Lymphs Abs: 1.8 10*3/uL (ref 0.7–4.0)
MCH: 29.6 pg (ref 26.0–34.0)
MCHC: 31.4 g/dL (ref 30.0–36.0)
MCV: 94.3 fL (ref 80.0–100.0)
Monocytes Absolute: 0.4 10*3/uL (ref 0.1–1.0)
Monocytes Relative: 5 %
Neutro Abs: 5.9 10*3/uL (ref 1.7–7.7)
Neutrophils Relative %: 72 %
Platelets: 297 10*3/uL (ref 150–400)
RBC: 3.88 MIL/uL (ref 3.87–5.11)
RDW: 14.2 % (ref 11.5–15.5)
WBC: 8.2 10*3/uL (ref 4.0–10.5)
nRBC: 0 % (ref 0.0–0.2)

## 2021-01-28 LAB — I-STAT CHEM 8, ED
BUN: 13 mg/dL (ref 6–20)
Calcium, Ion: 1.2 mmol/L (ref 1.15–1.40)
Chloride: 103 mmol/L (ref 98–111)
Creatinine, Ser: 0.8 mg/dL (ref 0.44–1.00)
Glucose, Bld: 89 mg/dL (ref 70–99)
HCT: 36 % (ref 36.0–46.0)
Hemoglobin: 12.2 g/dL (ref 12.0–15.0)
Potassium: 4 mmol/L (ref 3.5–5.1)
Sodium: 138 mmol/L (ref 135–145)
TCO2: 26 mmol/L (ref 22–32)

## 2021-01-28 LAB — COMPREHENSIVE METABOLIC PANEL
ALT: 12 U/L (ref 0–44)
AST: 19 U/L (ref 15–41)
Albumin: 3.5 g/dL (ref 3.5–5.0)
Alkaline Phosphatase: 62 U/L (ref 38–126)
Anion gap: 6 (ref 5–15)
BUN: 13 mg/dL (ref 6–20)
CO2: 25 mmol/L (ref 22–32)
Calcium: 8.7 mg/dL — ABNORMAL LOW (ref 8.9–10.3)
Chloride: 105 mmol/L (ref 98–111)
Creatinine, Ser: 0.77 mg/dL (ref 0.44–1.00)
GFR, Estimated: >60 mL/min (ref >60)
Glucose, Bld: 96 mg/dL (ref 70–99)
Potassium: 4 mmol/L (ref 3.5–5.1)
Sodium: 136 mmol/L (ref 135–145)
Total Bilirubin: 0.5 mg/dL (ref 0.3–1.2)
Total Protein: 7.3 g/dL (ref 6.5–8.1)

## 2021-01-28 LAB — I-STAT BETA HCG BLOOD, ED (MC, WL, AP ONLY): I-stat hCG, quantitative: <5 m[IU]/mL (ref <5)

## 2021-01-28 MED ORDER — SODIUM CHLORIDE 0.9 % IV BOLUS
1000.0000 mL | Freq: Once | INTRAVENOUS | Status: AC
  Administered 2021-01-28: 1000 mL via INTRAVENOUS

## 2021-01-28 MED ORDER — IOHEXOL 300 MG/ML  SOLN
100.0000 mL | Freq: Once | INTRAMUSCULAR | Status: AC | PRN
  Administered 2021-01-28: 100 mL via INTRAVENOUS

## 2021-01-28 NOTE — ED Triage Notes (Addendum)
Pt reports abdominal issue around umbilicus since hysterectomy back in December 2021. Denies pain but describes it as a "strange squishy" sensation.

## 2021-01-28 NOTE — Discharge Instructions (Signed)
You have a small umbilical hernia.  Please avoid heavy lifting or coughing forcefully   Follow-up with general surgery  Return to ER if you have worse abdominal pain, hernia not reducible, vomiting, not passing gas

## 2021-01-28 NOTE — ED Notes (Signed)
Pt to CT via stretcher

## 2021-01-28 NOTE — ED Provider Notes (Signed)
[facility] DEPT Provider Note   CSN: [id number] Arrival date & time: 01/28/21  2112     History Chief Complaint  Patient presents with  . Abdominal Pain    Tracy [name] is a 40 y.o. female history of anemia, hypertension, here presenting with abdominal pain.  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  She required a JP drain and another hospitalization stay.  She states that for the last 3 to 4 months, she noticed some swelling around her umbilicus.  She states that over the last week or so she noticed it more.  She denies any vomiting and is still passing gas.  She denies any urinary symptoms.  The history is provided by the patient.       Past Medical History:  Diagnosis Date  . Anemia   . Hypertension     Patient Active Problem List   Diagnosis Date Noted  . Abdominal pain 09/21/2020  . Post-operative pain 09/20/2020  . S/P abdominal hysterectomy 09/04/2020    Past Surgical History:  Procedure Laterality Date  . ABDOMINAL HYSTERECTOMY    . ANKLE SURGERY  2005  . CYSTOSCOPY N/A 09/04/2020   Procedure: CYSTOSCOPY;  Surgeon: Mora Bellman, MD;  Location: [facility];  Service: Gynecology;  Laterality: N/A;  . CYSTOSCOPY W/ URETERAL STENT PLACEMENT Bilateral 09/21/2020   Procedure: CYSTOSCOPY WITH BILATERAL RETROGRADE LEFT URETERAL STENT PLACEMENT;  Surgeon: Alexis Frock, MD;  Location: [facility];  Service: Urology;  Laterality: Bilateral;  . HYSTERECTOMY ABDOMINAL WITH SALPINGECTOMY N/A 09/04/2020   Procedure: HYSTERECTOMY ABDOMINAL WITH SALPINGECTOMY;  Surgeon: Mora Bellman, MD;  Location: [facility];  Service: Gynecology;  Laterality: N/A;  . KNEE SURGERY  2005   both   . OVARIAN CYST REMOVAL  2016  . OVARIAN CYST REMOVAL  2018  . URETERAL REIMPLANTION Bilateral 09/21/2020   Procedure: OPEN URETERAL REIMPLANT;  Surgeon: Alexis Frock, MD;  Location: [place] [place];  Service: Urology;  Laterality: Bilateral;  Abdominal  Approach     OB History    Gravida  2   Para  2   Term  2   Preterm      AB      Living  1     SAB      IAB      Ectopic      Multiple      Live Births  2           Family History  Problem Relation Age of Onset  . Diabetes Maternal Grandfather   . Hypertension Maternal Grandfather     Social History   Tobacco Use  . Smoking status: Former Research scientist (life sciences)  . Smokeless tobacco: Never Used  Vaping Use  . Vaping Use: Never used  Substance Use Topics  . Alcohol use: Yes    Comment: occassional  . Drug use: Yes    Types: Marijuana    Comment: 3 times per month when on menstrual cycle    Home Medications Prior to Admission medications   Medication Sig Start Date End Date Taking? Authorizing Provider  amLODipine (NORVASC) 5 MG tablet TAKE 1 TABLET (5 MG TOTAL) BY MOUTH DAILY. 09/26/20 09/26/21  Griffin Basil, MD  gabapentin (NEURONTIN) 100 MG capsule TAKE 1 CAPSULE (100 MG TOTAL) BY MOUTH 3 (THREE) TIMES DAILY FOR 7 DAYS. 09/26/20 09/26/21  Griffin Basil, MD  ibuprofen (ADVIL) 600 MG tablet TAKE 1 TABLET (600 MG TOTAL) BY MOUTH EVERY 6 (SIX) HOURS AS  NEEDED FOR MODERATE PAIN. 09/26/20 09/26/21  Griffin Basil, MD  oxyCODONE-acetaminophen (PERCOCET/ROXICET) 5-325 MG tablet TAKE 1-2 TABLETS BY MOUTH EVERY 4 (FOUR) HOURS AS NEEDED FOR UP TO 5 DAYS FOR SEVERE PAIN (FOR PAIN). 09/26/20 03/25/21  Griffin Basil, MD    Allergies    Patient has no known allergies.  Review of Systems   Review of Systems  Gastrointestinal: Positive for abdominal pain.  All other systems reviewed and are negative.   Physical Exam Updated Vital Signs BP (!) 149/92 (BP Location: Right Arm)   Pulse 85   Temp 98.4 F (36.9 C) (Oral)   Resp 18   Ht 5\' 9"  (1.753 m)   Wt 89.4 kg   LMP 08/31/2020   SpO2 100%   BMI 29.09 kg/m   Physical Exam Vitals and nursing note reviewed.  Constitutional:      Appearance: She is well-developed.  HENT:     Head: Normocephalic.     Mouth/Throat:      Mouth: Mucous membranes are moist.  Eyes:     Extraocular Movements: Extraocular movements intact.     Pupils: Pupils are equal, round, and reactive to light.  Cardiovascular:     Rate and Rhythm: Normal rate and regular rhythm.     Heart sounds: Normal heart sounds.  Pulmonary:     Effort: Pulmonary effort is normal.     Breath sounds: Normal breath sounds.  Abdominal:     General: Abdomen is flat.     Comments: DirectPatient has a small umbilical hernia that is easily reducible.  Patient has complicated hysterectomy scars that is healing well.  No obvious lower abdominal tenderness  Skin:    General: Skin is warm.     Capillary Refill: Capillary refill takes less than 2 seconds.  Neurological:     General: No focal deficit present.     Mental Status: She is alert and oriented to person, place, and time.  Psychiatric:        Mood and Affect: Mood normal.        Behavior: Behavior normal.     ED Results / Procedures / Treatments   Labs (all labs ordered are listed, but only abnormal results are displayed) Labs Reviewed  CBC WITH DIFFERENTIAL/PLATELET  COMPREHENSIVE METABOLIC PANEL  URINALYSIS, ROUTINE W REFLEX MICROSCOPIC  I-STAT BETA HCG BLOOD, ED ([facility], [facility], [facility] ONLY)  I-STAT CHEM 8, ED    EKG None  Radiology No results found.  Procedures Procedures   Medications Ordered in ED Medications  sodium chloride 0.9 % bolus 1,000 mL (has no administration in time range)    ED Course  I have reviewed the triage vital signs and the nursing notes.  Pertinent labs & imaging results that were available during my care of the patient were reviewed by me and considered in my medical decision making (see chart for details).    MDM Rules/Calculators/A&P                         Tracy [name] is a 40 y.o. female here with pain around her umbilicus.  I think likely small umbilical hernia. 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  We will get a CT  scan of her abdomen.  We will also get blood work.   11:32 PM CT showed fat-containing umbilical hernia.  No signs of obstruction.  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  Stable for discharge with follow-up with surgery.  Final Clinical Impression(s) / ED Diagnoses Final diagnoses:  None    Rx / DC Orders ED Discharge Orders    None       Drenda Freeze, MD 01/28/21 2332

## 2022-02-25 ENCOUNTER — Other Ambulatory Visit: Payer: Self-pay

## 2022-08-20 IMAGING — CT CT ABD-PELV W/ CM
2 of 5 series · 16 of 46 positions shown, 18 images · IV contrast (omnipaque)
Comparison: CT abdomen pelvis 09/25/2020, CT abdomen pelvis
09/20/2020

CLINICAL DATA: Pt reports abdominal issue around umbilicus since
hysterectomy and salpingectomy back in August 2020. abdominal
distension.

EXAM:
CT ABDOMEN AND PELVIS WITH CONTRAST
TECHNIQUE: Multidetector CT imaging of the abdomen and pelvis was performed
using the standard protocol following bolus administration of
intravenous contrast.
CONTRAST:  100mL OMNIPAQUE IOHEXOL 300 MG/ML  SOLN

[Series 2: axial st · axial · 0.72mm/px · z∈[-381,+29]mm · 13 of 94 slices shown, 15 images]
[im 6/94  soft-tissue]
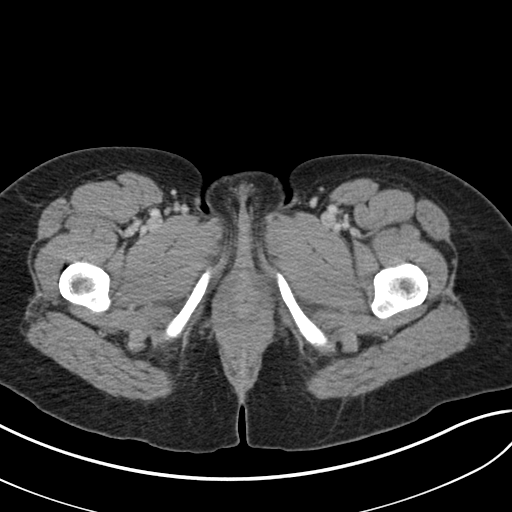
[im 6/94  bone]
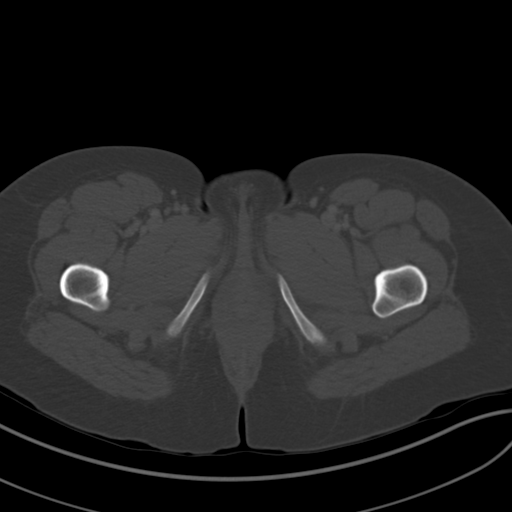
[im 11/94  soft-tissue]
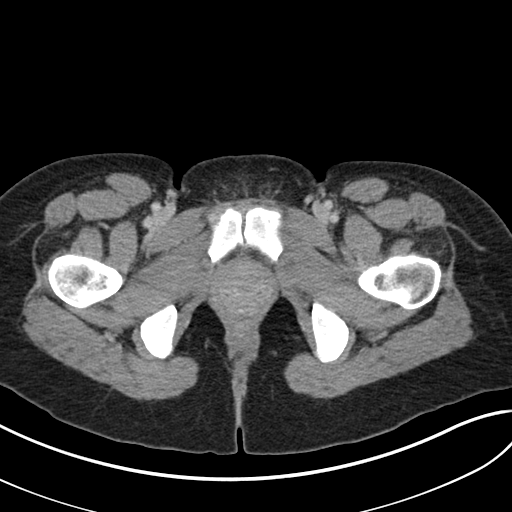
[im 22/94  soft-tissue]
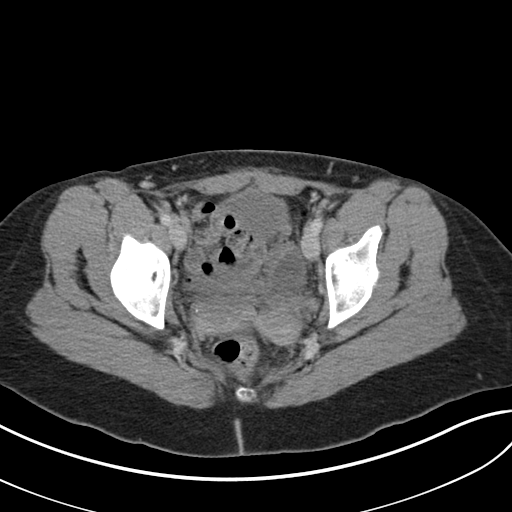
[im 28/94  soft-tissue]
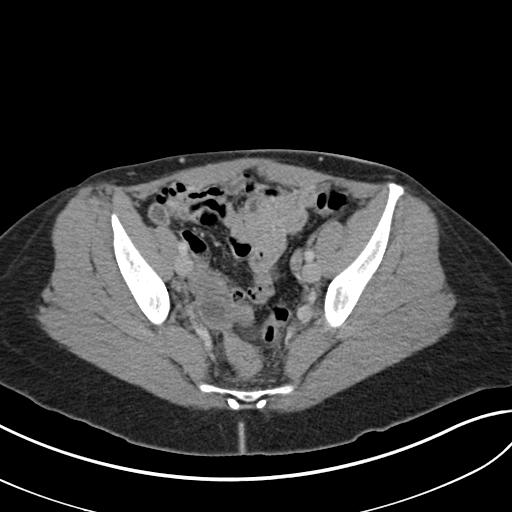
[im 33/94  soft-tissue]
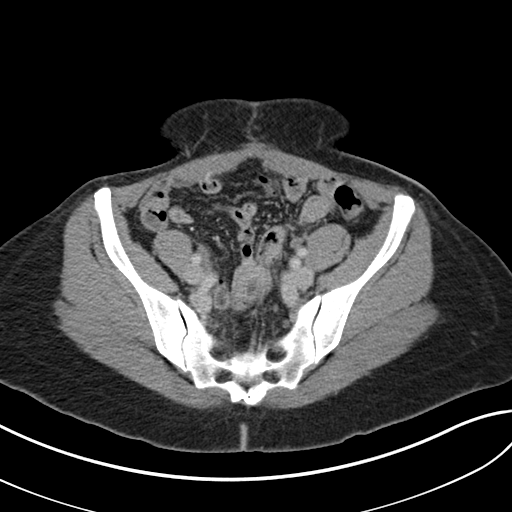
[im 39/94  soft-tissue]
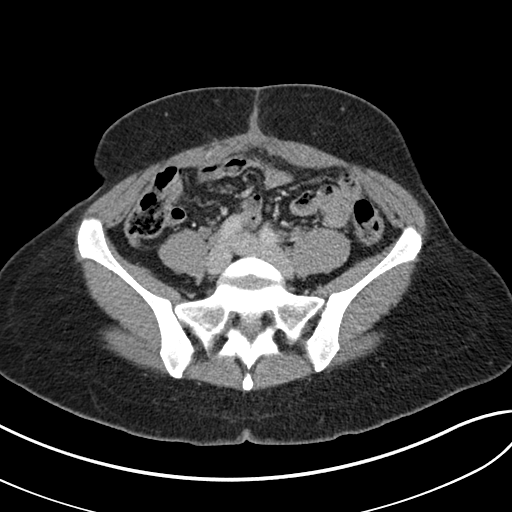
[im 50/94  soft-tissue]
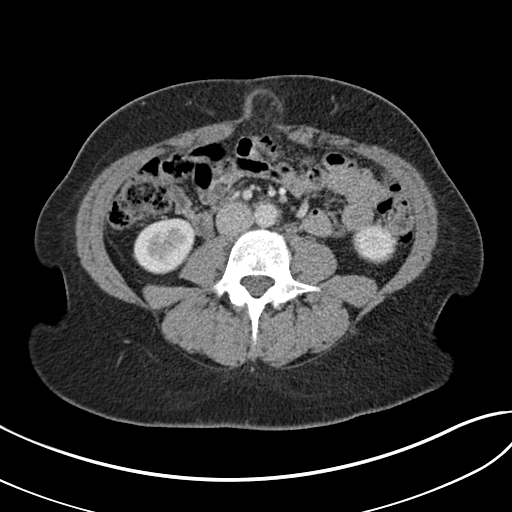
[im 55/94  soft-tissue]
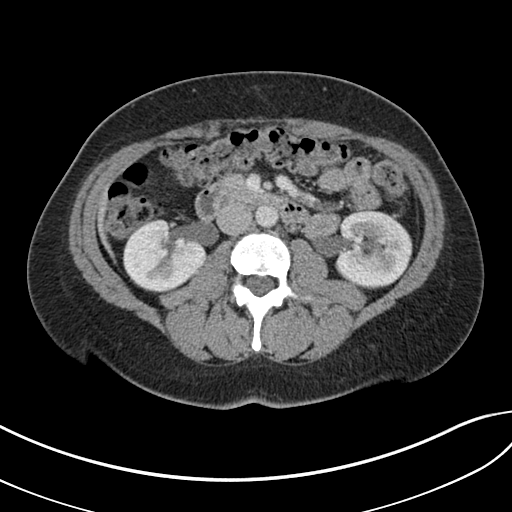
[im 61/94  soft-tissue]
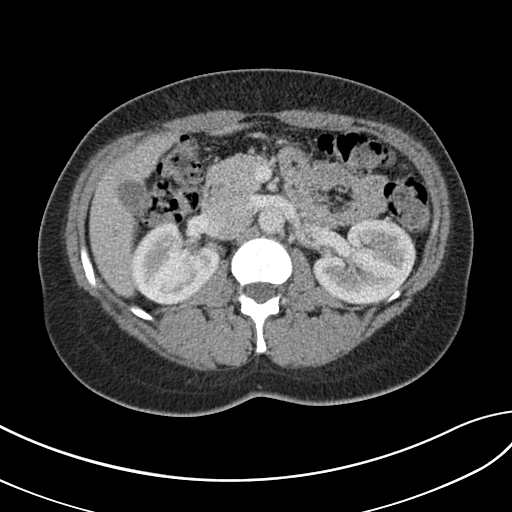
[im 61/94  bone]
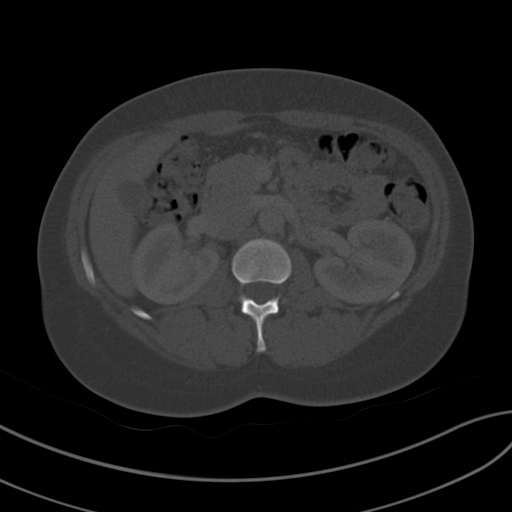
[im 66/94  soft-tissue]
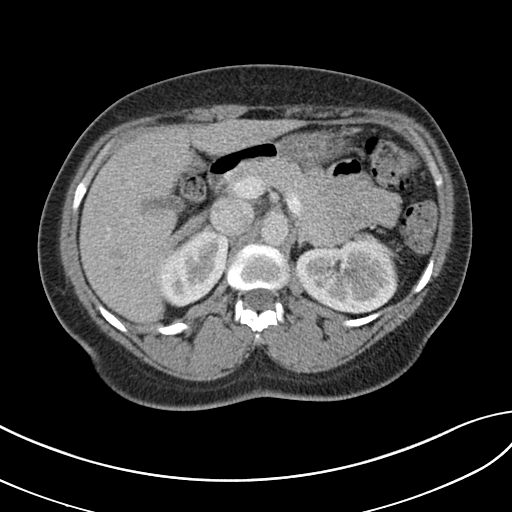
[im 72/94  soft-tissue]
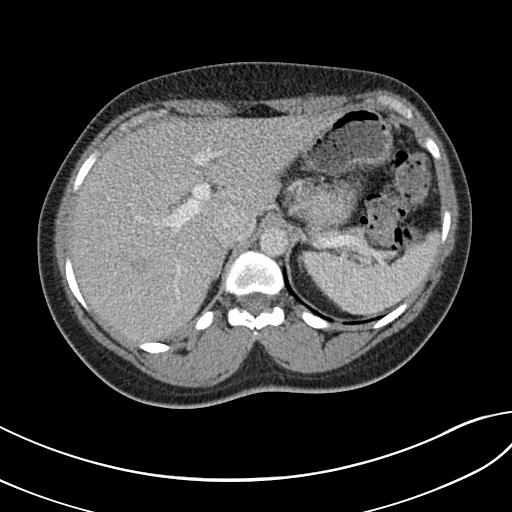
[im 83/94  soft-tissue]
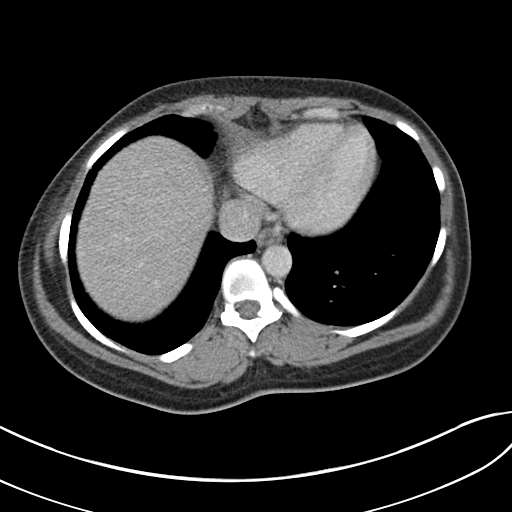
[im 88/94  soft-tissue]
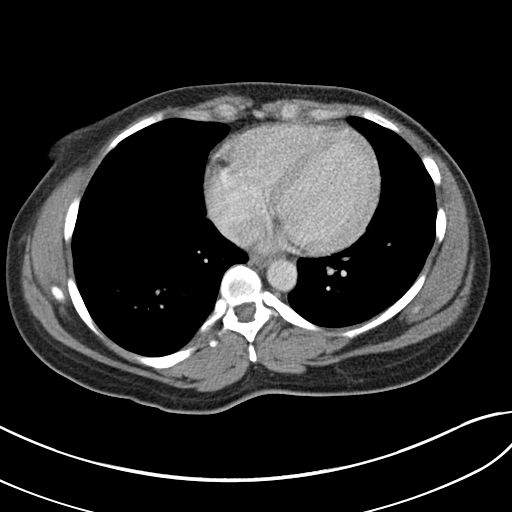

[Series 4: coronal st · coronal · 0.89mm/px · 3 of 148 slices shown]
[im 50/148  soft-tissue]
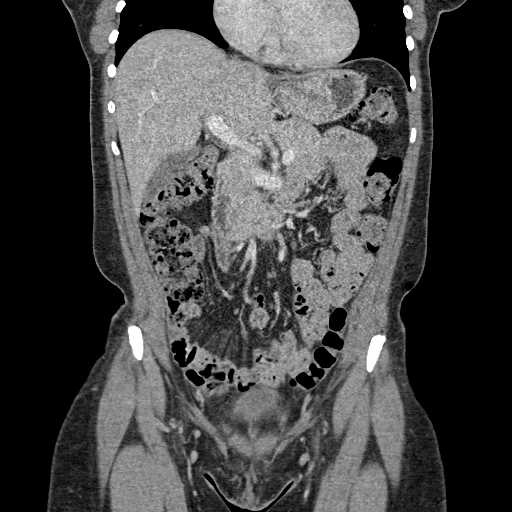
[im 66/148  soft-tissue]
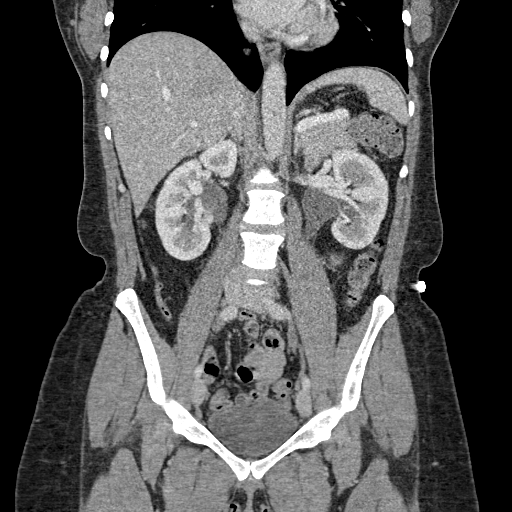
[im 82/148  soft-tissue]
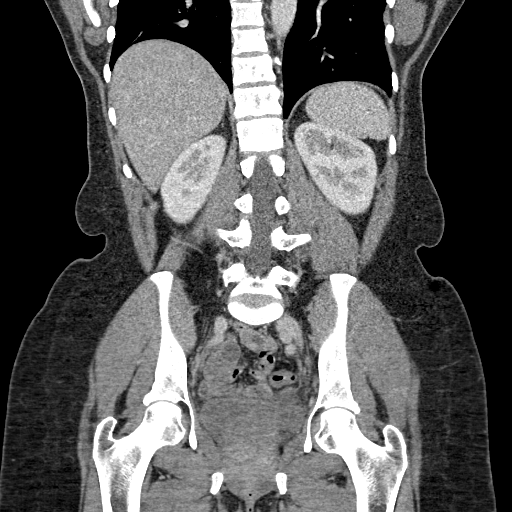

[16 of 46 positions shown; findings below may reference images not displayed]

FINDINGS: Lower chest: No acute abnormality.  Small hiatal hernia.

Hepatobiliary: No focal liver abnormality. No gallstones,
gallbladder wall thickening, or pericholecystic fluid. No biliary
dilatation.

Pancreas: No focal lesion. Normal pancreatic contour. No surrounding
inflammatory changes. No main pancreatic ductal dilatation.

Spleen: Normal in size without focal abnormality.

Adrenals/Urinary Tract:

No adrenal nodule bilaterally.

Bilateral kidneys enhance symmetrically. No hydronephrosis. No
hydroureter.

The urinary bladder is unremarkable.

Stomach/Bowel: Stomach is within normal limits. No evidence of bowel
wall thickening or dilatation. Scattered colonic diverticulosis.
Appendix appears normal.

Vascular/Lymphatic: No significant vascular findings are present. No
enlarged abdominal or pelvic lymph nodes.

Reproductive: Status post hysterectomy. Bilateral 1-2cm cystic
lesions within the adnexa as likely represent ovaries. No adnexal
masses.

Other: No intraperitoneal free fluid. No intraperitoneal free gas.
No organized fluid collection.

Musculoskeletal:

Healed anterior abdominal incision. Interval development of a small
fat containing umbilical hernia with an abdominal wall defect of
x 2.5 cm ([DATE], [DATE]).

No suspicious lytic or blastic osseous lesions. No acute displaced
fracture. Multilevel degenerative changes of the spine.
IMPRESSION: 1. Interval development of a small fat containing umbilical hernia.
No acute findings of ischemia or associated bowel obstruction.
2. Scattered colonic diverticulosis with no acute diverticulitis.
3. Tiny hiatal hernia.
